# Patient Record
Sex: Male | Born: 1975 | Race: White | Hispanic: No | Marital: Married | Smoking: Never smoker
Health system: Southern US, Community
[De-identification: ages and names within clinical notes are randomized; demographics above are authoritative.]

## PROBLEM LIST (undated history)

## (undated) DIAGNOSIS — F419 Anxiety disorder, unspecified: Secondary | ICD-10-CM

## (undated) DIAGNOSIS — F32A Depression, unspecified: Secondary | ICD-10-CM

## (undated) DIAGNOSIS — Z8619 Personal history of other infectious and parasitic diseases: Secondary | ICD-10-CM

## (undated) DIAGNOSIS — F909 Attention-deficit hyperactivity disorder, unspecified type: Secondary | ICD-10-CM

## (undated) DIAGNOSIS — F329 Major depressive disorder, single episode, unspecified: Secondary | ICD-10-CM

## (undated) HISTORY — PX: TONSILLECTOMY: SUR1361

## (undated) HISTORY — DX: Attention-deficit hyperactivity disorder, unspecified type: F90.9

## (undated) HISTORY — PX: SHOULDER SURGERY: SHX246

## (undated) HISTORY — PX: ANKLE SURGERY: SHX546

## (undated) HISTORY — PX: FRACTURE SURGERY: SHX138

## (undated) HISTORY — DX: Personal history of other infectious and parasitic diseases: Z86.19

---

## 1998-12-11 ENCOUNTER — Encounter: Payer: Self-pay | Admitting: Internal Medicine

## 1998-12-11 ENCOUNTER — Ambulatory Visit (HOSPITAL_COMMUNITY): Admission: RE | Admit: 1998-12-11 | Discharge: 1998-12-11 | Payer: Self-pay | Admitting: Internal Medicine

## 1999-03-03 ENCOUNTER — Emergency Department (HOSPITAL_COMMUNITY): Admission: EM | Admit: 1999-03-03 | Discharge: 1999-03-04 | Payer: Self-pay | Admitting: Emergency Medicine

## 1999-03-03 ENCOUNTER — Encounter: Payer: Self-pay | Admitting: Emergency Medicine

## 1999-03-04 ENCOUNTER — Encounter: Payer: Self-pay | Admitting: Emergency Medicine

## 2006-11-23 ENCOUNTER — Emergency Department (HOSPITAL_COMMUNITY): Admission: EM | Admit: 2006-11-23 | Discharge: 2006-11-23 | Payer: Self-pay | Admitting: Family Medicine

## 2008-10-12 ENCOUNTER — Observation Stay (HOSPITAL_COMMUNITY): Admission: EM | Admit: 2008-10-12 | Discharge: 2008-10-13 | Payer: Self-pay | Admitting: Emergency Medicine

## 2008-11-10 ENCOUNTER — Ambulatory Visit: Payer: Self-pay | Admitting: Vascular Surgery

## 2008-11-10 ENCOUNTER — Emergency Department (HOSPITAL_COMMUNITY): Admission: EM | Admit: 2008-11-10 | Discharge: 2008-11-10 | Payer: Self-pay | Admitting: Emergency Medicine

## 2008-11-10 ENCOUNTER — Encounter (INDEPENDENT_AMBULATORY_CARE_PROVIDER_SITE_OTHER): Payer: Self-pay | Admitting: Specialist

## 2008-11-11 ENCOUNTER — Emergency Department (HOSPITAL_COMMUNITY): Admission: EM | Admit: 2008-11-11 | Discharge: 2008-11-11 | Payer: Self-pay | Admitting: Emergency Medicine

## 2008-11-12 ENCOUNTER — Encounter (HOSPITAL_COMMUNITY): Admission: RE | Admit: 2008-11-12 | Discharge: 2008-11-13 | Payer: Self-pay | Admitting: Emergency Medicine

## 2009-01-02 ENCOUNTER — Encounter: Admission: RE | Admit: 2009-01-02 | Discharge: 2009-01-02 | Payer: Self-pay | Admitting: Specialist

## 2009-01-06 ENCOUNTER — Emergency Department (HOSPITAL_COMMUNITY): Admission: EM | Admit: 2009-01-06 | Discharge: 2009-01-06 | Payer: Self-pay | Admitting: Emergency Medicine

## 2009-01-06 ENCOUNTER — Ambulatory Visit: Payer: Self-pay | Admitting: Surgery

## 2009-01-06 ENCOUNTER — Encounter (INDEPENDENT_AMBULATORY_CARE_PROVIDER_SITE_OTHER): Payer: Self-pay | Admitting: Emergency Medicine

## 2009-08-22 ENCOUNTER — Emergency Department (HOSPITAL_COMMUNITY): Admission: EM | Admit: 2009-08-22 | Discharge: 2009-08-22 | Payer: Self-pay | Admitting: Emergency Medicine
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
## 2010-12-05 ENCOUNTER — Encounter: Payer: Self-pay | Admitting: Specialist

## 2011-02-17 LAB — POCT I-STAT, CHEM 8
BUN: 17 mg/dL (ref 6–23)
Calcium, Ion: 1.2 mmol/L (ref 1.12–1.32)
Chloride: 107 meq/L (ref 96–112)
Creatinine, Ser: 0.8 mg/dL (ref 0.4–1.5)
Glucose, Bld: 108 mg/dL — ABNORMAL HIGH (ref 70–99)
HCT: 46 % (ref 39.0–52.0)
Hemoglobin: 15.6 g/dL (ref 13.0–17.0)
Potassium: 3.9 mEq/L (ref 3.5–5.1)
Sodium: 144 meq/L (ref 135–145)
TCO2: 26 mmol/L (ref 0–100)

## 2011-03-01 LAB — PROTIME-INR
INR: 2.6 — ABNORMAL HIGH (ref 0.00–1.49)
Prothrombin Time: 29.4 seconds — ABNORMAL HIGH (ref 11.6–15.2)
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
## 2011-08-16 LAB — BASIC METABOLIC PANEL
CO2: 28 mEq/L (ref 19–32)
GFR calc Af Amer: >60 mL/min (ref >60)
Glucose, Bld: 94 mg/dL (ref 70–99)
Sodium: 140 mEq/L (ref 135–145)

## 2011-08-16 LAB — DIFFERENTIAL
Basophils Absolute: 0 10*3/uL (ref 0.0–0.1)
Basophils Relative: 0 % (ref 0–1)
Eosinophils Absolute: 0 10*3/uL (ref 0.0–0.7)
Eosinophils Relative: 0 % (ref 0–5)
Lymphocytes Relative: 17 % (ref 12–46)
Lymphs Abs: 1.8 10*3/uL (ref 0.7–4.0)
Monocytes Absolute: 0.6 10*3/uL (ref 0.1–1.0)
Monocytes Relative: 5 % (ref 3–12)
Neutro Abs: 8.7 10*3/uL — ABNORMAL HIGH (ref 1.7–7.7)
Neutrophils Relative %: 78 % — ABNORMAL HIGH (ref 43–77)

## 2011-08-16 LAB — CBC
MCV: 100.4 fL — ABNORMAL HIGH (ref 78.0–100.0)
WBC: 11.2 10*3/uL — ABNORMAL HIGH (ref 4.0–10.5)

## 2011-08-19 LAB — COMPREHENSIVE METABOLIC PANEL
ALT: 12 U/L (ref 0–53)
AST: 18 U/L (ref 0–37)
Albumin: 3.8 g/dL (ref 3.5–5.2)
Alkaline Phosphatase: 79 U/L (ref 39–117)
Calcium: 9.2 mg/dL (ref 8.4–10.5)
Creatinine, Ser: 0.92 mg/dL (ref 0.4–1.5)
GFR calc non Af Amer: >60 mL/min (ref >60)
Glucose, Bld: 97 mg/dL (ref 70–99)
Potassium: 4 mEq/L (ref 3.5–5.1)
Sodium: 138 mEq/L (ref 135–145)

## 2011-08-19 LAB — CBC: HCT: 45.9 % (ref 39.0–52.0)

## 2011-08-19 LAB — DIFFERENTIAL
Basophils Absolute: 0 10*3/uL (ref 0.0–0.1)
Basophils Relative: 0 % (ref 0–1)
Eosinophils Absolute: 0.1 10*3/uL (ref 0.0–0.7)
Lymphocytes Relative: 26 % (ref 12–46)
Lymphs Abs: 2.4 10*3/uL (ref 0.7–4.0)
Monocytes Absolute: 0.4 10*3/uL (ref 0.1–1.0)
Monocytes Relative: 4 % (ref 3–12)
Neutrophils Relative %: 68 % (ref 43–77)

## 2011-08-19 LAB — PROTIME-INR: INR: 1 (ref 0.00–1.49)

## 2012-04-26 ENCOUNTER — Emergency Department (HOSPITAL_COMMUNITY)
Admission: EM | Admit: 2012-04-26 | Discharge: 2012-04-26 | Disposition: A | Discharge status: Home / Self Care | Payer: No Typology Code available for payment source

## 2012-04-26 ENCOUNTER — Emergency Department (HOSPITAL_COMMUNITY)
Admission: EM | Admit: 2012-04-26 | Discharge: 2012-04-26 | Disposition: A | Discharge status: Home / Self Care | Payer: No Typology Code available for payment source | Attending: Emergency Medicine | Admitting: Emergency Medicine

## 2012-04-26 ENCOUNTER — Encounter (HOSPITAL_COMMUNITY): Payer: Self-pay | Admitting: *Deleted

## 2012-04-26 DIAGNOSIS — L0889 Other specified local infections of the skin and subcutaneous tissue: Secondary | ICD-10-CM | POA: Insufficient documentation

## 2012-04-26 DIAGNOSIS — L089 Local infection of the skin and subcutaneous tissue, unspecified: Secondary | ICD-10-CM

## 2012-04-26 MED ORDER — CEPHALEXIN 500 MG PO CAPS: 500.0000 mg | ORAL_CAPSULE | Freq: Four times a day (QID) | ORAL | Status: AC

## 2012-04-26 NOTE — ED Notes (Signed)
Patient walked outside.  Attempted to talk to him but he continued to walk out the door.

## 2012-04-26 NOTE — ED Notes (Signed)
TRIAGE NURSE EXPLAINED PROCESS  TO PT. THAT HE WILL NEED TO SEEN BY A PROVIDER/ EDP BEFORE HE CAN RECEIVE A PAIN MEDICATION , PT. GOT UPSET WITH TRIAGE NURSE " GET OFF MY F ---ING FACE".

## 2012-04-26 NOTE — ED Notes (Signed)
To ED for eval of raised, red, painful area to back of head in scalp. States he had a bump and tried to pop it on Sunday.

## 2012-04-26 NOTE — Discharge Instructions (Signed)

## 2012-04-26 NOTE — ED Provider Notes (Signed)
History  Scribed for Kyle Co, MD, the patient was seen in room STRE1/STRE1. This chart was scribed by Candelaria Stagers. The patient's care started at 5:39 PM   CSN: [id number]  Arrival date & time 04/26/12  1642   First MD Initiated Contact with Patient 04/26/12 1731      Chief Complaint  Patient presents with  . Abscess   The history is provided by the patient.   Kyle Butler is a 36 y.o. male who presents to the Emergency Department complaining of an abscess on the back of his head that started about two years ago and has gotten worse after he tried to stick a pin in it four days ago.  He has h/o previous abscess to the head.   Nothing seems to make the sx better or worse.   History reviewed. No pertinent past medical history.  History reviewed. No pertinent past surgical history.  History reviewed. No pertinent family history.  History  Substance Use Topics  . Smoking status: Never Smoker   . Smokeless tobacco: Not on file  . Alcohol Use: No      Review of Systems  All other systems reviewed and are negative.    Allergies  Review of patient's allergies indicates no known allergies.  Home Medications   Current Outpatient Rx  Name Route Sig Dispense Refill  . CLONAZEPAM 1 MG PO TABS Oral Take 2 mg by mouth at bedtime.      BP 174/77  Pulse 78  Temp 98.1 F (36.7 C) (Oral)  Resp 20  SpO2 97%  Physical Exam  Nursing note and vitals reviewed. Constitutional: He is oriented to person, place, and time. He appears well-developed and well-nourished. No distress.  HENT:  Head: Normocephalic and atraumatic.       Mild 1 cm fluctuant mass.  Mild erythema.  No hair loss.  No drainage.   Eyes: EOM are normal.  Neck: Neck supple. No tracheal deviation present.  Cardiovascular: Normal rate.   Pulmonary/Chest: Effort normal. No respiratory distress.  Musculoskeletal: Normal range of motion.  Neurological: He is alert and oriented to person, place, and time.    Skin: Skin is warm and dry.  Psychiatric: He has a normal mood and affect. His behavior is normal.    ED Course  Procedures   INCISION AND DRAINAGE Performed by: Kyle Butler Consent: Verbal consent obtained. Risks and benefits: risks, benefits and alternatives were discussed Time out performed prior to procedure Type: abscess  Body area: Posterior scalp  Anesthesia: Local  Local anesthetic: lidocaine 2 % with epinephrine  Anesthetic total: 3 ml  Complexity: complex Blunt dissection to break up loculations  Drainage: Cystic body and non-foul smelling cystic discharge   Drainage amount: Small   Packing material: None   Patient tolerance: Patient tolerated the procedure well with no immediate complications.     DIAGNOSTIC STUDIES: Oxygen Saturation is 97% on room air, normal by my interpretation.    COORDINATION OF CARE:     Labs Reviewed - No data to display No results found.   1. Infected cyst of skin       MDM  Is likely infected sebaceous cyst.  This was incised at the bedside.  Home on antibiotics with PCP followup.  No indication for packing.    I personally performed the services described in this documentation, which was scribed in my presence. The recorded information has been reviewed and considered.  Kyle Co, MD 04/26/12 [phone]

## 2012-04-26 NOTE — ED Notes (Addendum)
Pt presented to the ER with complaint of an abscess to the back of his head which he attempted to drain by opening the abscess and squeezing the drainage but was unable to do it. Now the area is red and swollen. Pt denies any fever.

## 2012-04-26 NOTE — ED Notes (Signed)
Patient was seen by MD earlier and when asked if he needed pain med he said no but now he is hurting and is wanting something for pain

## 2012-04-26 NOTE — ED Notes (Signed)
Incision and drainage done by Dr. Patria Mane, patient tolerated the procedure well.Marland Kitchen

## 2012-11-30 ENCOUNTER — Encounter (HOSPITAL_COMMUNITY): Payer: Self-pay | Admitting: Emergency Medicine

## 2012-11-30 ENCOUNTER — Emergency Department (HOSPITAL_COMMUNITY)
Admission: EM | Admit: 2012-11-30 | Discharge: 2012-11-30 | Discharge status: Against Medical Advice | Payer: No Typology Code available for payment source | Attending: Emergency Medicine | Admitting: Emergency Medicine

## 2012-11-30 DIAGNOSIS — F329 Major depressive disorder, single episode, unspecified: Secondary | ICD-10-CM | POA: Insufficient documentation

## 2012-11-30 DIAGNOSIS — R55 Syncope and collapse: Secondary | ICD-10-CM | POA: Insufficient documentation

## 2012-11-30 HISTORY — DX: Anxiety disorder, unspecified: F41.9

## 2012-11-30 HISTORY — DX: Major depressive disorder, single episode, unspecified: F32.9

## 2012-11-30 HISTORY — DX: Depression, unspecified: F32.A

## 2012-11-30 NOTE — ED Notes (Signed)
Pt sts he feels much better and wishes to leave; pt IV removed and ambulated independently out of department

## 2012-11-30 NOTE — ED Notes (Signed)
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

## 2013-08-19 ENCOUNTER — Ambulatory Visit: Payer: No Typology Code available for payment source | Admitting: Interventional Cardiology

## 2013-09-05 ENCOUNTER — Encounter: Payer: Self-pay | Admitting: Interventional Cardiology

## 2013-09-05 DIAGNOSIS — F419 Anxiety disorder, unspecified: Secondary | ICD-10-CM | POA: Insufficient documentation

## 2013-09-06 ENCOUNTER — Ambulatory Visit (INDEPENDENT_AMBULATORY_CARE_PROVIDER_SITE_OTHER): Payer: No Typology Code available for payment source | Admitting: Interventional Cardiology

## 2013-09-06 ENCOUNTER — Encounter: Payer: Self-pay | Admitting: Interventional Cardiology

## 2013-09-06 VITALS — BP 118/70 | HR 59 | Ht 70.0 in | Wt 243.4 lb

## 2013-09-06 DIAGNOSIS — F411 Generalized anxiety disorder: Secondary | ICD-10-CM

## 2013-09-06 DIAGNOSIS — F329 Major depressive disorder, single episode, unspecified: Secondary | ICD-10-CM

## 2013-09-06 DIAGNOSIS — F419 Anxiety disorder, unspecified: Secondary | ICD-10-CM

## 2013-09-06 DIAGNOSIS — R002 Palpitations: Secondary | ICD-10-CM | POA: Insufficient documentation

## 2013-09-06 NOTE — Patient Instructions (Signed)
Your physician recommends that you continue on your current medications as directed. Please refer to the Current Medication list given to you today.  Your physician has recommended that you wear a holter monitor. Holter monitors are medical devices that record the heart's electrical activity. Doctors most often use these monitors to diagnose arrhythmias. Arrhythmias are problems with the speed or rhythm of the heartbeat. The monitor is a small, portable device. You can wear one while you do your normal daily activities. This is usually used to diagnose what is causing palpitations/syncope (passing out).   Your physician recommends that you schedule a follow-up appointment in: As Needed
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
## 2013-10-11 ENCOUNTER — Ambulatory Visit (INDEPENDENT_AMBULATORY_CARE_PROVIDER_SITE_OTHER): Payer: 59 | Admitting: Emergency Medicine

## 2013-10-11 VITALS — BP 125/80 | HR 77 | Temp 98.1°F | Resp 18 | Ht 70.0 in | Wt 246.0 lb

## 2013-10-11 DIAGNOSIS — Z0000 Encounter for general adult medical examination without abnormal findings: Secondary | ICD-10-CM

## 2013-10-11 DIAGNOSIS — F410 Panic disorder [episodic paroxysmal anxiety] without agoraphobia: Secondary | ICD-10-CM

## 2013-10-11 LAB — POCT CBC
Granulocyte percent: 55 %G (ref 37–80)
Hemoglobin: 16.4 g/dL (ref 14.1–18.1)
Lymph, poc: 2.5 (ref 0.6–3.4)
MCH, POC: 33 pg — AB (ref 27–31.2)
MPV: 8.6 fL (ref 0–99.8)
POC Granulocyte: 3.6 (ref 2–6.9)
POC LYMPH PERCENT: 38.6 %L (ref 10–50)
Platelet Count, POC: 216 10*3/uL (ref 142–424)
WBC: 6.5 10*3/uL (ref 4.6–10.2)

## 2013-10-11 LAB — POCT URINALYSIS DIPSTICK
Blood, UA: NEGATIVE
Nitrite, UA: NEGATIVE
Protein, UA: NEGATIVE

## 2013-10-11 LAB — LIPID PANEL
Cholesterol: 158 mg/dL (ref 0–200)
HDL: 33 mg/dL — ABNORMAL LOW (ref >39)
VLDL: 33 mg/dL (ref 0–40)

## 2013-10-11 LAB — COMPREHENSIVE METABOLIC PANEL
Alkaline Phosphatase: 67 U/L (ref 39–117)
BUN: 16 mg/dL (ref 6–23)
CO2: 32 mEq/L (ref 19–32)
Calcium: 9.5 mg/dL (ref 8.4–10.5)
Potassium: 4.2 mEq/L (ref 3.5–5.3)
Sodium: 140 mEq/L (ref 135–145)
Total Bilirubin: 0.8 mg/dL (ref 0.3–1.2)
Total Protein: 6.8 g/dL (ref 6.0–8.3)

## 2013-10-11 MED ORDER — PAROXETINE HCL 20 MG PO TABS: 20.0000 mg | ORAL_TABLET | Freq: Every day | ORAL | Status: DC
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
## 2013-10-11 NOTE — Addendum Note (Signed)
Addended by: Carmelina Dane on: 10/11/2013 12:16 PM   Modules accepted: Orders

## 2013-10-11 NOTE — Patient Instructions (Signed)

## 2014-10-27 ENCOUNTER — Ambulatory Visit (INDEPENDENT_AMBULATORY_CARE_PROVIDER_SITE_OTHER): Payer: 59 | Admitting: Internal Medicine

## 2014-10-27 DIAGNOSIS — Z0000 Encounter for general adult medical examination without abnormal findings: Secondary | ICD-10-CM

## 2014-10-27 DIAGNOSIS — Z13220 Encounter for screening for lipoid disorders: Secondary | ICD-10-CM

## 2014-10-27 DIAGNOSIS — Z79899 Other long term (current) drug therapy: Secondary | ICD-10-CM

## 2014-10-27 DIAGNOSIS — F39 Unspecified mood [affective] disorder: Secondary | ICD-10-CM

## 2014-10-27 DIAGNOSIS — Z1329 Encounter for screening for other suspected endocrine disorder: Secondary | ICD-10-CM

## 2014-10-27 LAB — POCT CBC
GRANULOCYTE PERCENT: 58.1 % (ref 37–80)
HCT, POC: 45.8 % (ref 43.5–53.7)
HEMOGLOBIN: 15.3 g/dL (ref 14.1–18.1)
Lymph, poc: 2.7 (ref 0.6–3.4)
MCH, POC: 33 pg — AB (ref 27–31.2)
MCHC: 33.4 g/dL (ref 31.8–35.4)
MCV: 98.6 fL — AB (ref 80–97)
MID (cbc): 0.3 (ref 0–0.9)
MPV: 7.5 fL (ref 0–99.8)
POC Granulocyte: 4.2 (ref 2–6.9)
POC LYMPH %: 37.3 % (ref 10–50)
POC MID %: 4.6 %M (ref 0–12)
Platelet Count, POC: 207 10*3/uL (ref 142–424)
RBC: 4.65 M/uL — AB (ref 4.69–6.13)
RDW, POC: 12.8 %
WBC: 7.3 10*3/uL (ref 4.6–10.2)

## 2014-10-27 LAB — TSH: TSH: 3.389 u[IU]/mL (ref 0.350–4.500)

## 2014-10-27 LAB — COMPREHENSIVE METABOLIC PANEL
ALBUMIN: 4 g/dL (ref 3.5–5.2)
ALK PHOS: 75 U/L (ref 39–117)
ALT: 13 U/L (ref 0–53)
AST: 19 U/L (ref 0–37)
BUN: 18 mg/dL (ref 6–23)
CALCIUM: 9.3 mg/dL (ref 8.4–10.5)
CHLORIDE: 107 meq/L (ref 96–112)
CO2: 24 mEq/L (ref 19–32)
Creat: 0.86 mg/dL (ref 0.50–1.35)
Glucose, Bld: 102 mg/dL — ABNORMAL HIGH (ref 70–99)
POTASSIUM: 4 meq/L (ref 3.5–5.3)
Sodium: 139 mEq/L (ref 135–145)
TOTAL PROTEIN: 6.5 g/dL (ref 6.0–8.3)
Total Bilirubin: 0.5 mg/dL (ref 0.2–1.2)

## 2014-10-27 LAB — LIPID PANEL
Cholesterol: 159 mg/dL (ref 0–200)
HDL: 33 mg/dL — ABNORMAL LOW (ref >39)
LDL CALC: 103 mg/dL — AB (ref 0–99)
TRIGLYCERIDES: 115 mg/dL (ref <150)
Total CHOL/HDL Ratio: 4.8 Ratio
VLDL: 23 mg/dL (ref 0–40)

## 2014-10-27 LAB — POCT URINALYSIS DIPSTICK
Bilirubin, UA: NEGATIVE
GLUCOSE UA: NEGATIVE
KETONES UA: NEGATIVE
Leukocytes, UA: NEGATIVE
NITRITE UA: NEGATIVE
PROTEIN UA: NEGATIVE
RBC UA: NEGATIVE
SPEC GRAV UA: 1.02
Urobilinogen, UA: 0.2
pH, UA: 5.5

## 2014-10-27 NOTE — Progress Notes (Signed)
   Subjective:    Patient ID: Kyle Butler, male    DOB: 1976-11-08, 38 y.o.   MRN: [id number]  HPI Doing well, exercising, has lost weight. Mood disorder stable, controlled with meds from Dr. Kizzie BaneHughes No complaints   Review of Systems  Constitutional: Negative.   HENT: Negative.   Eyes: Negative.   Respiratory: Negative.   Cardiovascular: Negative.   Gastrointestinal: Negative.   Endocrine: Negative.   Genitourinary: Negative.   Musculoskeletal: Negative.   Skin: Negative.   Allergic/Immunologic: Negative.   Neurological: Negative.   Hematological: Negative.   Psychiatric/Behavioral: Negative.        Objective:   Physical Exam  Constitutional: He is oriented to person, place, and time. He appears well-developed and well-nourished. No distress.  HENT:  Head: Normocephalic.  Right Ear: External ear normal.  Left Ear: External ear normal.  Nose: Nose normal.  Mouth/Throat: Oropharynx is clear and moist.  Eyes: Conjunctivae and EOM are normal. Pupils are equal, round, and reactive to light.  Neck: Normal range of motion. Neck supple. No tracheal deviation present. No thyromegaly present.  Cardiovascular: Normal rate, regular rhythm, normal heart sounds and intact distal pulses.   Pulmonary/Chest: Effort normal and breath sounds normal.  Abdominal: Soft. Bowel sounds are normal.  Genitourinary: Penis normal.  Musculoskeletal: Normal range of motion.  Neurological: He is alert and oriented to person, place, and time. He has normal reflexes. He exhibits normal muscle tone. Coordination normal.  Skin: No rash noted.  Psychiatric: He has a normal mood and affect. His behavior is normal. Judgment and thought content normal.  Vitals reviewed.  Results for orders placed or performed in visit on 10/27/14  POCT CBC  Result Value Ref Range   WBC 7.3 4.6 - 10.2 K/uL   Lymph, poc 2.7 0.6 - 3.4   POC LYMPH PERCENT 37.3 10 - 50 %L   MID (cbc) 0.3 0 - 0.9   POC MID % 4.6 0 - 12  %M   POC Granulocyte 4.2 2 - 6.9   Granulocyte percent 58.1 37 - 80 %G   RBC 4.65 (A) 4.69 - 6.13 M/uL   Hemoglobin 15.3 14.1 - 18.1 g/dL   HCT, POC 82.945.8 56.243.5 - 53.7 %   MCV 98.6 (A) 80 - 97 fL   MCH, POC 33.0 (A) 27 - 31.2 pg   MCHC 33.4 31.8 - 35.4 g/dL   RDW, POC 13.012.8 %   Platelet Count, POC 207 142 - 424 K/uL   MPV 7.5 0 - 99.8 fL  POCT urinalysis dipstick  Result Value Ref Range   Color, UA yellow    Clarity, UA clear    Glucose, UA neg    Bilirubin, UA neg    Ketones, UA neg    Spec Grav, UA 1.020    Blood, UA neg    pH, UA 5.5    Protein, UA neg    Urobilinogen, UA 0.2    Nitrite, UA neg    Leukocytes, UA Negative           Assessment & Plan:  CPE/Healthy

## 2014-10-27 NOTE — Patient Instructions (Signed)
Calorie Counting for Weight Loss Calories are energy you get from the things you eat and drink. Your body uses this energy to keep you going throughout the day. The number of calories you eat affects your weight. When you eat more calories than your body needs, your body stores the extra calories as fat. When you eat fewer calories than your body needs, your body burns fat to get the energy it needs. Calorie counting means keeping track of how many calories you eat and drink each day. If you make sure to eat fewer calories than your body needs, you should lose weight. In order for calorie counting to work, you will need to eat the number of calories that are right for you in a day to lose a healthy amount of weight per week. A healthy amount of weight to lose per week is usually 1-2 lb (0.5-0.9 kg). A dietitian can determine how many calories you need in a day and give you suggestions on how to reach your calorie goal.  WHAT IS MY MY PLAN? My goal is to have __________ calories per day.  If I have this many calories per day, I should lose around __________ pounds per week. WHAT DO I NEED TO KNOW ABOUT CALORIE COUNTING? In order to meet your daily calorie goal, you will need to:  Find out how many calories are in each food you would like to eat. Try to do this before you eat.  Decide how much of the food you can eat.  Write down what you ate and how many calories it had. Doing this is called keeping a food log. WHERE DO I FIND CALORIE INFORMATION? The number of calories in a food can be found on a Nutrition Facts label. Note that all the information on a label is based on a specific serving of the food. If a food does not have a Nutrition Facts label, try to look up the calories online or ask your dietitian for help. HOW DO I DECIDE HOW MUCH TO EAT? To decide how much of the food you can eat, you will need to consider both the number of calories in one serving and the size of one serving. This  information can be found on the Nutrition Facts label. If a food does not have a Nutrition Facts label, look up the information online or ask your dietitian for help. Remember that calories are listed per serving. If you choose to have more than one serving of a food, you will have to multiply the calories per serving by the amount of servings you plan to eat. For example, the label on a package of bread might say that a serving size is 1 slice and that there are 90 calories in a serving. If you eat 1 slice, you will have eaten 90 calories. If you eat 2 slices, you will have eaten 180 calories. HOW DO I KEEP A FOOD LOG? After each meal, record the following information in your food log:  What you ate.  How much of it you ate.  How many calories it had.  Then, add up your calories. Keep your food log near you, such as in a small notebook in your pocket. Another option is to use a mobile app or website. Some programs will calculate calories for you and show you how many calories you have left each time you add an item to the log. WHAT ARE SOME CALORIE COUNTING TIPS?  Use your calories on foods   and drinks that will fill you up and not leave you hungry. Some examples of this include foods like nuts and nut butters, vegetables, lean proteins, and high-fiber foods (more than 5 g fiber per serving).  Eat nutritious foods and avoid empty calories. Empty calories are calories you get from foods or beverages that do not have many nutrients, such as candy and soda. It is better to have a nutritious high-calorie food (such as an avocado) than a food with few nutrients (such as a bag of chips).  Know how many calories are in the foods you eat most often. This way, you do not have to look up how many calories they have each time you eat them.  Look out for foods that may seem like low-calorie foods but are really high-calorie foods, such as baked goods, soda, and fat-free candy.  Pay attention to calories  in drinks. Drinks such as sodas, specialty coffee drinks, alcohol, and juices have a lot of calories yet do not fill you up. Choose low-calorie drinks like water and diet drinks.  Focus your calorie counting efforts on higher calorie items. Logging the calories in a garden salad that contains only vegetables is less important than calculating the calories in a milk shake.  Find a way of tracking calories that works for you. Get creative. Most people who are successful find ways to keep track of how much they eat in a day, even if they do not count every calorie. WHAT ARE SOME PORTION CONTROL TIPS?  Know how many calories are in a serving. This will help you know how many servings of a certain food you can have.  Use a measuring cup to measure serving sizes. This is helpful when you start out. With time, you will be able to estimate serving sizes for some foods.  Take some time to put servings of different foods on your favorite plates, bowls, and cups so you know what a serving looks like.  Try not to eat straight from a bag or box. Doing this can lead to overeating. Put the amount you would like to eat in a cup or on a plate to make sure you are eating the right portion.  Use smaller plates, glasses, and bowls to prevent overeating. This is a quick and easy way to practice portion control. If your plate is smaller, less food can fit on it.  Try not to multitask while eating, such as watching TV or using your computer. If it is time to eat, sit down at a table and enjoy your food. Doing this will help you to start recognizing when you are full. It will also make you more aware of what and how much you are eating. HOW CAN I CALORIE COUNT WHEN EATING OUT?  Ask for smaller portion sizes or child-sized portions.  Consider sharing an entree and sides instead of getting your own entree.  If you get your own entree, eat only half. Ask for a box at the beginning of your meal and put the rest of your  entree in it so you are not tempted to eat it.  Look for the calories on the menu. If calories are listed, choose the lower calorie options.  Choose dishes that include vegetables, fruits, whole grains, low-fat dairy products, and lean protein. Focusing on smart food choices from each of the 5 food groups can help you stay on track at restaurants.  Choose items that are boiled, broiled, grilled, or steamed.  Choose   water, milk, unsweetened iced tea, or other drinks without added sugars. If you want an alcoholic beverage, choose a lower calorie option. For example, a regular margarita can have up to 700 calories and a glass of wine has around 150.  Stay away from items that are buttered, battered, fried, or served with cream sauce. Items labeled "crispy" are usually fried, unless stated otherwise.  Ask for dressings, sauces, and syrups on the side. These are usually very high in calories, so do not eat much of them.  Watch out for salads. Many people think salads are a healthy option, but this is often not the case. Many salads come with bacon, fried chicken, lots of cheese, fried chips, and dressing. All of these items have a lot of calories. If you want a salad, choose a garden salad and ask for grilled meats or steak. Ask for the dressing on the side, or ask for olive oil and vinegar or lemon to use as dressing.  Estimate how many servings of a food you are given. For example, a serving of cooked rice is  cup or about the size of half a tennis ball or one cupcake wrapper. Knowing serving sizes will help you be aware of how much food you are eating at restaurants. The list below tells you how big or small some common portion sizes are based on everyday objects.  1 oz--4 stacked dice.  3 oz--1 deck of cards.  1 tsp--1 dice.  1 Tbsp-- a Ping-Pong ball.  2 Tbsp--1 Ping-Pong ball.   cup--1 tennis ball or 1 cupcake wrapper.  1 cup--1 baseball. Document Released: 10/31/2005 Document  Revised: 03/17/2014 Document Reviewed: 09/05/2013 [employer] Patient Information 2015 [employer]. This information is not intended to replace advice given to you by your health care provider. Make sure you discuss any questions you have with your health care provider. DASH Eating Plan DASH stands for "Dietary Approaches to Stop Hypertension." The DASH eating plan is a healthy eating plan that has been shown to reduce high blood pressure (hypertension). Additional health benefits may include reducing the risk of type 2 diabetes mellitus, heart disease, and stroke. The DASH eating plan may also help with weight loss. WHAT DO I NEED TO KNOW ABOUT THE DASH EATING PLAN? For the DASH eating plan, you will follow these general guidelines:  Choose foods with a percent daily value for sodium of less than 5% (as listed on the food label).  Use salt-free seasonings or herbs instead of table salt or sea salt.  Check with your health care provider or pharmacist before using salt substitutes.  Eat lower-sodium products, often labeled as "lower sodium" or "no salt added."  Eat fresh foods.  Eat more vegetables, fruits, and low-fat dairy products.  Choose whole grains. Look for the word "whole" as the first word in the ingredient list.  Choose fish and skinless chicken or turkey more often than red meat. Limit fish, poultry, and meat to 6 oz (170 g) each day.  Limit sweets, desserts, sugars, and sugary drinks.  Choose heart-healthy fats.  Limit cheese to 1 oz (28 g) per day.  Eat more home-cooked food and less restaurant, buffet, and fast food.  Limit fried foods.  Cook foods using methods other than frying.  Limit canned vegetables. If you do use them, rinse them well to decrease the sodium.  When eating at a restaurant, ask that your food be prepared with less salt, or no salt if possible. WHAT FOODS CAN I   EAT? Seek help from a dietitian for individual calorie needs. Grains Whole grain  or whole wheat bread. Brown rice. Whole grain or whole wheat pasta. Quinoa, bulgur, and whole grain cereals. Low-sodium cereals. Corn or whole wheat flour tortillas. Whole grain cornbread. Whole grain crackers. Low-sodium crackers. Vegetables Fresh or frozen vegetables (raw, steamed, roasted, or grilled). Low-sodium or reduced-sodium tomato and vegetable juices. Low-sodium or reduced-sodium tomato sauce and paste. Low-sodium or reduced-sodium canned vegetables.  Fruits All fresh, canned (in natural juice), or frozen fruits. Meat and Other Protein Products Ground beef (85% or leaner), grass-fed beef, or beef trimmed of fat. Skinless chicken or turkey. Ground chicken or turkey. Pork trimmed of fat. All fish and seafood. Eggs. Dried beans, peas, or lentils. Unsalted nuts and seeds. Unsalted canned beans. Dairy Low-fat dairy products, such as skim or 1% milk, 2% or reduced-fat cheeses, low-fat ricotta or cottage cheese, or plain low-fat yogurt. Low-sodium or reduced-sodium cheeses. Fats and Oils Tub margarines without trans fats. Light or reduced-fat mayonnaise and salad dressings (reduced sodium). Avocado. Safflower, olive, or canola oils. Natural peanut or almond butter. Other Unsalted popcorn and pretzels. The items listed above may not be a complete list of recommended foods or beverages. Contact your dietitian for more options. WHAT FOODS ARE NOT RECOMMENDED? Grains White bread. White pasta. White rice. Refined cornbread. Bagels and croissants. Crackers that contain trans fat. Vegetables Creamed or fried vegetables. Vegetables in a cheese sauce. Regular canned vegetables. Regular canned tomato sauce and paste. Regular tomato and vegetable juices. Fruits Dried fruits. Canned fruit in light or heavy syrup. Fruit juice. Meat and Other Protein Products Fatty cuts of meat. Ribs, chicken wings, bacon, sausage, bologna, salami, chitterlings, fatback, hot dogs, bratwurst, and packaged luncheon meats.  Salted nuts and seeds. Canned beans with salt. Dairy Whole or 2% milk, cream, half-and-half, and cream cheese. Whole-fat or sweetened yogurt. Full-fat cheeses or blue cheese. Nondairy creamers and whipped toppings. Processed cheese, cheese spreads, or cheese curds. Condiments Onion and garlic salt, seasoned salt, table salt, and sea salt. Canned and packaged gravies. Worcestershire sauce. Tartar sauce. Barbecue sauce. Teriyaki sauce. Soy sauce, including reduced sodium. Steak sauce. Fish sauce. Oyster sauce. Cocktail sauce. Horseradish. Ketchup and mustard. Meat flavorings and tenderizers. Bouillon cubes. Hot sauce. Tabasco sauce. Marinades. Taco seasonings. Relishes. Fats and Oils Butter, stick margarine, lard, shortening, ghee, and bacon fat. Coconut, palm kernel, or palm oils. Regular salad dressings. Other Pickles and olives. Salted popcorn and pretzels. The items listed above may not be a complete list of foods and beverages to avoid. Contact your dietitian for more information. WHERE CAN I FIND MORE INFORMATION? National Heart, Lung, and [employer] Institute: www.nhlbi.nih.gov/health/health-topics/topics/dash/ Document Released: 10/20/2011 Document Revised: 03/17/2014 Document Reviewed: 09/04/2013 Exit[employer] Patient Information 2015 [employer]. This information is not intended to replace advice given to you by your health care provider. Make sure you discuss any questions you have with your health care provider.  

## 2014-10-28 ENCOUNTER — Encounter: Payer: Self-pay | Admitting: Radiology

## 2015-11-03 ENCOUNTER — Encounter: Payer: Self-pay | Admitting: Urgent Care

## 2015-11-03 ENCOUNTER — Ambulatory Visit (INDEPENDENT_AMBULATORY_CARE_PROVIDER_SITE_OTHER): Payer: 59 | Admitting: Urgent Care

## 2015-11-03 VITALS — BP 108/88 | HR 63 | Temp 97.8°F | Resp 18 | Ht 70.0 in | Wt 250.8 lb

## 2015-11-03 DIAGNOSIS — Z0000 Encounter for general adult medical examination without abnormal findings: Secondary | ICD-10-CM | POA: Diagnosis not present

## 2015-11-03 LAB — CBC
HEMATOCRIT: 45.5 % (ref 39.0–52.0)
Hemoglobin: 16 g/dL (ref 13.0–17.0)
MCH: 34.1 pg — AB (ref 26.0–34.0)
MCHC: 35.2 g/dL (ref 30.0–36.0)
MCV: 97 fL (ref 78.0–100.0)
MPV: 10.5 fL (ref 8.6–12.4)
Platelets: 189 10*3/uL (ref 150–400)
RBC: 4.69 MIL/uL (ref 4.22–5.81)
RDW: 12.6 % (ref 11.5–15.5)
WBC: 5.9 10*3/uL (ref 4.0–10.5)

## 2015-11-03 LAB — LIPID PANEL
Cholesterol: 169 mg/dL (ref 125–200)
HDL: 27 mg/dL — AB (ref >=40)
LDL CALC: 115 mg/dL (ref <130)
Total CHOL/HDL Ratio: 6.3 Ratio — ABNORMAL HIGH (ref <=5.0)
Triglycerides: 135 mg/dL (ref <150)
VLDL: 27 mg/dL (ref <30)

## 2015-11-03 LAB — COMPREHENSIVE METABOLIC PANEL
ALK PHOS: 63 U/L (ref 40–115)
ALT: 14 U/L (ref 9–46)
AST: 22 U/L (ref 10–40)
Albumin: 4.1 g/dL (ref 3.6–5.1)
BUN: 15 mg/dL (ref 7–25)
CALCIUM: 9 mg/dL (ref 8.6–10.3)
CHLORIDE: 104 mmol/L (ref 98–110)
CO2: 28 mmol/L (ref 20–31)
Creat: 0.9 mg/dL (ref 0.60–1.35)
GLUCOSE: 100 mg/dL — AB (ref 65–99)
POTASSIUM: 4.1 mmol/L (ref 3.5–5.3)
Sodium: 139 mmol/L (ref 135–146)
Total Bilirubin: 0.7 mg/dL (ref 0.2–1.2)
Total Protein: 6.3 g/dL (ref 6.1–8.1)

## 2015-11-03 LAB — TSH: TSH: 3.481 u[IU]/mL (ref 0.350–4.500)

## 2015-11-03 NOTE — Progress Notes (Signed)
    MRN: [id number]  Subjective:   Mr. Kyle Butler is a 39 y.o. male presenting for annual physical exam.  Medical care team includes: PCP: Lillia CarmelHILTS,MICHAEL J, MD Specialists: None.   Patient is married, lives at home with his wife and 2 children. He works as a Psychologist, occupationalwelder. Diet is unhealthy and does not exercise. Denies smoking cigarettes or drinking alcohol.   Kyle Butler has Depression; Anxiety; and Rapid palpitations on his problem list.  Kyle Butler has a current medication list which includes the following prescription(s): alprazolam, amphetamine-dextroamphetamine, and amphetamine-dextroamphetamine. He has No Known Allergies.  Kyle Butler  has a past medical history of Anxiety and Depression. Also  has past surgical history that includes Shoulder surgery; Ankle surgery; and Fracture surgery.  His family history includes Dementia in his mother; Parkinson's disease in his father.  Immunizations: Flu shot 08/2015  Review of Systems  Constitutional: Negative for fever, chills, weight loss, malaise/fatigue and diaphoresis.  HENT: Negative for congestion, ear discharge, ear pain, hearing loss, nosebleeds, sore throat and tinnitus.   Eyes: Negative for blurred vision, double vision, photophobia, pain, discharge and redness.  Respiratory: Negative for cough, shortness of breath and wheezing.   Cardiovascular: Negative for chest pain, palpitations and leg swelling.  Gastrointestinal: Negative for nausea, vomiting, abdominal pain, diarrhea, constipation and blood in stool.  Genitourinary: Negative for dysuria, urgency, frequency, hematuria and flank pain.  Musculoskeletal: Negative for myalgias, back pain and joint pain.  Skin: Negative for itching and rash.  Neurological: Negative for dizziness, tingling, seizures, loss of consciousness, weakness and headaches.  Endo/Heme/Allergies: Negative for polydipsia.  Psychiatric/Behavioral: Negative for depression, suicidal ideas, hallucinations, memory loss and  substance abuse. The patient is not nervous/anxious and does not have insomnia.      Objective:   Vitals: BP 108/88 mmHg  Pulse 63  Temp(Src) 97.8 F (36.6 C) (Oral)  Resp 18  Ht 5\' 10"  (1.778 m)  Wt 250 lb 12.8 oz (113.762 kg)  BMI 35.99 kg/m2  SpO2 98%  Physical Exam  Constitutional: He is oriented to person, place, and time. He appears well-developed and well-nourished.  HENT:  TM's intact bilaterally, no effusions or erythema. Nasal turbinates pink and moist, nasal passages patent. No sinus tenderness. Oropharynx clear, mucous membranes moist, dentition in good repair.  Eyes: Conjunctivae and EOM are normal. Pupils are equal, round, and reactive to light. Right eye exhibits no discharge. Left eye exhibits no discharge. No scleral icterus.  Neck: Normal range of motion. Neck supple. No thyromegaly present.  Cardiovascular: Normal rate, regular rhythm and intact distal pulses.  Exam reveals no gallop and no friction rub.   No murmur heard. Pulmonary/Chest: No stridor. No respiratory distress. He has no wheezes. He has no rales.  Abdominal: Soft. Bowel sounds are normal. He exhibits no distension and no mass. There is no tenderness.  Musculoskeletal: Normal range of motion. He exhibits no edema or tenderness.  Lymphadenopathy:    He has no cervical adenopathy.  Neurological: He is alert and oriented to person, place, and time.  Skin: Skin is warm and dry. No rash noted. No erythema. No pallor.  Psychiatric: He has a normal mood and affect.   Assessment and Plan :   1. Annual physical exam - Patient is medically healthy, labs pending. - Discussed healthy lifestyle, diet, exercise, preventative care, vaccinations, and addressed patient's concerns.   Wallis BambergMario Dazhane Villagomez, PA-C [facility] [phone] 11/03/2015  8:55 AM

## 2015-11-03 NOTE — Patient Instructions (Signed)

## 2016-10-12 ENCOUNTER — Ambulatory Visit (INDEPENDENT_AMBULATORY_CARE_PROVIDER_SITE_OTHER): Payer: 59 | Admitting: Family

## 2016-10-12 ENCOUNTER — Encounter: Payer: Self-pay | Admitting: Family

## 2016-10-12 ENCOUNTER — Other Ambulatory Visit (INDEPENDENT_AMBULATORY_CARE_PROVIDER_SITE_OTHER): Payer: 59

## 2016-10-12 VITALS — BP 120/90 | HR 73 | Temp 98.4°F | Resp 16 | Ht 70.0 in | Wt 257.0 lb

## 2016-10-12 DIAGNOSIS — E6609 Other obesity due to excess calories: Secondary | ICD-10-CM | POA: Diagnosis not present

## 2016-10-12 DIAGNOSIS — Z0000 Encounter for general adult medical examination without abnormal findings: Secondary | ICD-10-CM

## 2016-10-12 DIAGNOSIS — Z6836 Body mass index (BMI) 36.0-36.9, adult: Secondary | ICD-10-CM

## 2016-10-12 DIAGNOSIS — E66812 Obesity, class 2: Secondary | ICD-10-CM

## 2016-10-12 DIAGNOSIS — Z23 Encounter for immunization: Secondary | ICD-10-CM | POA: Diagnosis not present

## 2016-10-12 DIAGNOSIS — E669 Obesity, unspecified: Secondary | ICD-10-CM | POA: Insufficient documentation

## 2016-10-12 LAB — COMPREHENSIVE METABOLIC PANEL
ALBUMIN: 4 g/dL (ref 3.5–5.2)
ALT: 13 U/L (ref 0–53)
AST: 20 U/L (ref 0–37)
Alkaline Phosphatase: 64 U/L (ref 39–117)
BILIRUBIN TOTAL: 0.6 mg/dL (ref 0.2–1.2)
BUN: 25 mg/dL — ABNORMAL HIGH (ref 6–23)
CALCIUM: 9 mg/dL (ref 8.4–10.5)
CO2: 31 meq/L (ref 19–32)
CREATININE: 0.92 mg/dL (ref 0.40–1.50)
Chloride: 105 mEq/L (ref 96–112)
GFR: 96.92 mL/min (ref >60.00)
Glucose, Bld: 97 mg/dL (ref 70–99)
Potassium: 4.2 mEq/L (ref 3.5–5.1)
Sodium: 141 mEq/L (ref 135–145)
TOTAL PROTEIN: 6.5 g/dL (ref 6.0–8.3)

## 2016-10-12 LAB — CBC
HCT: 43.9 % (ref 39.0–52.0)
Hemoglobin: 15.1 g/dL (ref 13.0–17.0)
MCHC: 34.4 g/dL (ref 30.0–36.0)
MCV: 96.2 fl (ref 78.0–100.0)
PLATELETS: 197 10*3/uL (ref 150.0–400.0)
RBC: 4.56 Mil/uL (ref 4.22–5.81)
RDW: 12.6 % (ref 11.5–15.5)
WBC: 8.4 10*3/uL (ref 4.0–10.5)

## 2016-10-12 LAB — LIPID PANEL
CHOLESTEROL: 151 mg/dL (ref 0–200)
HDL: 29.9 mg/dL — ABNORMAL LOW (ref >39.00)
LDL CALC: 83 mg/dL (ref 0–99)
NonHDL: 121.41
Total CHOL/HDL Ratio: 5
Triglycerides: 193 mg/dL — ABNORMAL HIGH (ref 0.0–149.0)
VLDL: 38.6 mg/dL (ref 0.0–40.0)

## 2016-10-12 LAB — TSH: TSH: 3.54 u[IU]/mL (ref 0.35–4.50)

## 2016-10-12 NOTE — Progress Notes (Signed)
Subjective:    Patient ID: Kyle Butler, male    DOB: 05/10/1976, 40 y.o.   MRN: [id number]  Chief Complaint  Patient presents with  . Establish Care    CPE, not fasting     HPI:  Kyle Butler is a 40 y.o. male who presents today for an annual wellness visit.   1) Health Maintenance -   Diet - Averages about 2-3 meals per day consisting of a regular diet; Caffeine intake of 1-2 cups daily  Exercise - 2-3 times per week at the gym primarily cardio.    2) Preventative Exams / Immunizations:  Dental -- Due for exam  Vision -- Due for exam   Health Maintenance  Topic Date Due  . HIV Screening  12/06/1991  . TETANUS/TDAP  12/06/1995  . INFLUENZA VACCINE  02/11/2017 (Originally 06/14/2016)    Immunization History  Administered Date(s) Administered  . Influenza-Unspecified 07/28/2014  . Tdap 10/12/2016     No Known Allergies   Outpatient Medications Prior to Visit  Medication Sig Dispense Refill  . ALPRAZolam (XANAX PO) Take by mouth.    Marland Kitchen amphetamine-dextroamphetamine (ADDERALL XR) 10 MG 24 hr capsule Take 10 mg by mouth daily. Reported on 11/03/2015    . amphetamine-dextroamphetamine (ADDERALL) 15 MG tablet Take 15 mg by mouth daily.     No facility-administered medications prior to visit.      Past Medical History:  Diagnosis Date  . ADHD   . Anxiety   . Depression   . History of chicken pox      Past Surgical History:  Procedure Laterality Date  . ANKLE SURGERY     RIGHT  . FRACTURE SURGERY    . SHOULDER SURGERY     LEFT  . TONSILLECTOMY       Family History  Problem Relation Age of Onset  . Dementia Mother   . Parkinson's disease Father      Social History   Social History  . Marital status: Married    Spouse name: N/A  . Number of children: 2  . Years of education: 14   Occupational History  . Welder    Social History Main Topics  . Smoking status: Never Smoker  . Smokeless tobacco: Never Used  . Alcohol use No  .  Drug use: No  . Sexual activity: Yes   Other Topics Concern  . Not on file   Social History Narrative   Fun: Work in the garage; build cars       Review of Systems  Constitutional: Denies fever, chills, fatigue, or significant weight gain/loss. HENT: Head: Denies headache or neck pain Ears: Denies changes in hearing, ringing in ears, earache, drainage Nose: Denies discharge, stuffiness, itching, nosebleed, sinus pain Throat: Denies sore throat, hoarseness, dry mouth, sores, thrush Eyes: Denies loss/changes in vision, pain, redness, blurry/double vision, flashing lights Cardiovascular: Denies chest pain/discomfort, tightness, palpitations, shortness of breath with activity, difficulty lying down, swelling, sudden awakening with shortness of breath Respiratory: Denies shortness of breath, cough, sputum production, wheezing Gastrointestinal: Denies dysphasia, heartburn, change in appetite, nausea, change in bowel habits, rectal bleeding, constipation, diarrhea, yellow skin or eyes Genitourinary: Denies frequency, urgency, burning/pain, blood in urine, incontinence, change in urinary strength. Musculoskeletal: Denies muscle/joint pain, stiffness, back pain, redness or swelling of joints, trauma Skin: Denies rashes, lumps, itching, dryness, color changes, or hair/nail changes Neurological: Denies dizziness, fainting, seizures, weakness, numbness, tingling, tremor Psychiatric - Denies nervousness, stress, depression or memory loss Endocrine: Denies  heat or cold intolerance, sweating, frequent urination, excessive thirst, changes in appetite Hematologic: Denies ease of bruising or bleeding     Objective:     BP 120/90 (BP Location: Left Arm, Patient Position: Sitting, Cuff Size: Large)   Pulse 73   Temp 98.4 F (36.9 C) (Oral)   Resp 16   Ht 5\' 10"  (1.778 m)   Wt 257 lb (116.6 kg)   SpO2 96%   BMI 36.88 kg/m  Nursing note and vital signs reviewed.  Physical Exam    Constitutional: He is oriented to person, place, and time. He appears well-developed and well-nourished.  HENT:  Head: Normocephalic.  Right Ear: Hearing, tympanic membrane, external ear and ear canal normal.  Left Ear: Hearing, tympanic membrane, external ear and ear canal normal.  Nose: Nose normal.  Mouth/Throat: Uvula is midline, oropharynx is clear and moist and mucous membranes are normal.  Eyes: Conjunctivae and EOM are normal. Pupils are equal, round, and reactive to light.  Neck: Neck supple. No JVD present. No tracheal deviation present. No thyromegaly present.  Cardiovascular: Normal rate, regular rhythm, normal heart sounds and intact distal pulses.   Pulmonary/Chest: Effort normal and breath sounds normal.  Abdominal: Soft. Bowel sounds are normal. He exhibits no distension and no mass. There is no tenderness. There is no rebound and no guarding.  Musculoskeletal: Normal range of motion. He exhibits no edema or tenderness.  Lymphadenopathy:    He has no cervical adenopathy.  Neurological: He is alert and oriented to person, place, and time. He has normal reflexes. No cranial nerve deficit. He exhibits normal muscle tone. Coordination normal.  Skin: Skin is warm and dry.  Psychiatric: He has a normal mood and affect. His behavior is normal. Judgment and thought content normal.       Assessment & Plan:   Problem List Items Addressed This Visit      Other   Routine general medical examination at a health care facility - Primary    1) Anticipatory Guidance: Discussed importance of wearing a seatbelt while driving and not texting while driving; changing batteries in smoke detector at least once annually; wearing suntan lotion when outside; eating a balanced and moderate diet; getting physical activity at least 30 minutes per day.  2) Immunizations / Screenings / Labs:  Tetanus updated today. Declines influenza. All other immunizations are up to date per recommendations. Due  for a dental and vision exam encouraged to be completed independently. All other screenings are up to date per recommendations. Obtain CBC, TSH, CMET, and lipid profile.    Overall well exam with risk factors for cardiovascular disease including obesity. Recommend weight loss of 5-10% of current body weight nutrition and physical activity. Work and family life are going well and attends activities with the family on the weekends. Continue other healthy lifestyle behaviors and choices. Follow-up prevention exam in 1 year. Follow-up office visit pending blood work as needed.       Relevant Orders   CBC   Comprehensive metabolic panel   TSH   Lipid Profile   Obesity    BMI of 36.8. Recommend weight loss of 5-10% of current body weight. Recommend increasing physical activity to 30 minutes of moderate level activity daily. Encourage nutritional intake that focuses on nutrient dense foods and is moderate, varied, and balanced and is low in saturated fats and processed/sugary foods. Continue to monitor.         Other Visit Diagnoses    Need for diphtheria-tetanus-pertussis (  Tdap) vaccine       Relevant Orders   Tdap vaccine greater than or equal to 7yo IM (Completed)       I am having Mr. Bomkamp maintain his ALPRAZolam (XANAX PO), amphetamine-dextroamphetamine, and amphetamine-dextroamphetamine.   Follow-up: Return if symptoms worsen or fail to improve.   Jeanine Luzalone, Gregory, FNP

## 2016-10-12 NOTE — Assessment & Plan Note (Signed)
BMI of 36.8. Recommend weight loss of 5-10% of current body weight. Recommend increasing physical activity to 30 minutes of moderate level activity daily. Encourage nutritional intake that focuses on nutrient dense foods and is moderate, varied, and balanced and is low in saturated fats and processed/sugary foods. Continue to monitor.

## 2016-10-12 NOTE — Patient Instructions (Addendum)
Thank you for choosing [facility].  SUMMARY AND INSTRUCTIONS:   Medication:  Please continue to take your medication as prescribed.   Your prescription(s) have been submitted to your pharmacy or been printed and provided for you. Please take as directed and contact our office if you believe you are having problem(s) with the medication(s) or have any questions.  Labs:  Please stop by the lab on the lower level of the building for your blood work. Your results will be released to MyChart (or called to you) after review, usually within 72 hours after test completion. If any changes need to be made, you will be notified at that same time.  1.) The lab is open from 7:30am to 5:30 pm Monday-Friday 2.) No appointment is necessary 3.) Fasting (if needed) is 6-8 hours after food and drink; black coffee and water are okay   Imaging / Radiology:  Please stop by radiology on the basement level of the building for your x-rays. Your results will be released to MyChart (or called to you) after review, usually within 72 hours after test completion. If any treatments or changes are necessary, you will be notified at that same time.  Referrals:  Referrals have been made during this visit. You should expect to hear back from our schedulers in about 7-10 days in regards to establishing an appointment with the specialists we discussed.   Follow up:  If your symptoms worsen or fail to improve, please contact our office for further instruction, or in case of emergency go directly to the emergency room at the closest medical facility.   Health Maintenance, Male A healthy lifestyle and preventative care can promote health and wellness.  Maintain regular health, dental, and eye exams.  Eat a healthy diet. Foods like vegetables, fruits, whole grains, low-fat dairy products, and lean protein foods contain the nutrients you need and are low in calories. Decrease your intake of foods high in solid  fats, added sugars, and salt. Get information about a proper diet from your health care provider, if necessary.  Regular physical exercise is one of the most important things you can do for your health. Most adults should get at least 150 minutes of moderate-intensity exercise (any activity that increases your heart rate and causes you to sweat) each week. In addition, most adults need muscle-strengthening exercises on 2 or more days a week.   Maintain a healthy weight. The body mass index (BMI) is a screening tool to identify possible weight problems. It provides an estimate of body fat based on height and weight. Your health care provider can find your BMI and can help you achieve or maintain a healthy weight. For males 20 years and older:  A BMI below 18.5 is considered underweight.  A BMI of 18.5 to 24.9 is normal.  A BMI of 25 to 29.9 is considered overweight.  A BMI of 30 and above is considered obese.  Maintain normal blood lipids and cholesterol by exercising and minimizing your intake of saturated fat. Eat a balanced diet with plenty of fruits and vegetables. Blood tests for lipids and cholesterol should begin at age 27 and be repeated every 5 years. If your lipid or cholesterol levels are high, you are over age 72, or you are at high risk for heart disease, you may need your cholesterol levels checked more frequently.Ongoing high lipid and cholesterol levels should be treated with medicines if diet and exercise are not working.  If you smoke, find out from  your health care provider how to quit. If you do not use tobacco, do not start.  Lung cancer screening is recommended for adults aged 55-80 years who are at high risk for developing lung cancer because of a history of smoking. A yearly low-dose CT scan of the lungs is recommended for people who have at least a 30-pack-year history of smoking and are current smokers or have quit within the past 15 years. A pack year of smoking is  smoking an average of 1 pack of cigarettes a day for 1 year (for example, a 30-pack-year history of smoking could mean smoking 1 pack a day for 30 years or 2 packs a day for 15 years). Yearly screening should continue until the smoker has stopped smoking for at least 15 years. Yearly screening should be stopped for people who develop a health problem that would prevent them from having lung cancer treatment.  If you choose to drink alcohol, do not have more than 2 drinks per day. One drink is considered to be 12 oz (360 mL) of beer, 5 oz (150 mL) of wine, or 1.5 oz (45 mL) of liquor.  Avoid the use of street drugs. Do not share needles with anyone. Ask for help if you need support or instructions about stopping the use of drugs.  High blood pressure causes heart disease and increases the risk of stroke. High blood pressure is more likely to develop in:  People who have blood pressure in the end of the normal range (100-139/85-89 mm Hg).  People who are overweight or obese.  People who are African American.  If you are 3018-39 years of age, have your blood pressure checked every 3-5 years. If you are 39 years of age or older, have your blood pressure checked every year. You should have your blood pressure measured twice-once when you are at a hospital or clinic, and once when you are not at a hospital or clinic. Record the average of the two measurements. To check your blood pressure when you are not at a hospital or clinic, you can use:  An automated blood pressure machine at a pharmacy.  A home blood pressure monitor.  If you are 5645-39 years old, ask your health care provider if you should take aspirin to prevent heart disease.  Diabetes screening involves taking a blood sample to check your fasting blood sugar level. This should be done once every 3 years after age 39 if you are at a normal weight and without risk factors for diabetes. Testing should be considered at a younger age or be carried  out more frequently if you are overweight and have at least 1 risk factor for diabetes.  Colorectal cancer can be detected and often prevented. Most routine colorectal cancer screening begins at the age of 39 and continues through age 39. However, your health care provider may recommend screening at an earlier age if you have risk factors for colon cancer. On a yearly basis, your health care provider may provide home test kits to check for hidden blood in the stool. A small camera at the end of a tube may be used to directly examine the colon (sigmoidoscopy or colonoscopy) to detect the earliest forms of colorectal cancer. Talk to your health care provider about this at age 39 when routine screening begins. A direct exam of the colon should be repeated every 5-10 years through age 39, unless early forms of precancerous polyps or small growths are found.  People who  are at an increased risk for hepatitis B should be screened for this virus. You are considered at high risk for hepatitis B if:  You were born in a country where hepatitis B occurs often. Talk with your health care provider about which countries are considered high risk.  Your parents were born in a high-risk country and you have not received a shot to protect against hepatitis B (hepatitis B vaccine).  You have HIV or AIDS.  You use needles to inject street drugs.  You live with, or have sex with, someone who has hepatitis B.  You are a man who has sex with other men (MSM).  You get hemodialysis treatment.  You take certain medicines for conditions like cancer, organ transplantation, and autoimmune conditions.  Hepatitis C blood testing is recommended for all people born from [id number] through 1964 and any individual with known risk factors for hepatitis C.  Healthy men should no longer receive prostate-specific antigen (PSA) blood tests as part of routine cancer screening. Talk to your health care provider about prostate cancer  screening.  Testicular cancer screening is not recommended for adolescents or adult males who have no symptoms. Screening includes self-exam, a health care provider exam, and other screening tests. Consult with your health care provider about any symptoms you have or any concerns you have about testicular cancer.  Practice safe sex. Use condoms and avoid high-risk sexual practices to reduce the spread of sexually transmitted infections (STIs).  You should be screened for STIs, including gonorrhea and chlamydia if:  You are sexually active and are younger than 24 years.  You are older than 24 years, and your health care provider tells you that you are at risk for this type of infection.  Your sexual activity has changed since you were last screened, and you are at an increased risk for chlamydia or gonorrhea. Ask your health care provider if you are at risk.  If you are at risk of being infected with HIV, it is recommended that you take a prescription medicine daily to prevent HIV infection. This is called pre-exposure prophylaxis (PrEP). You are considered at risk if:  You are a man who has sex with other men (MSM).  You are a heterosexual man who is sexually active with multiple partners.  You take drugs by injection.  You are sexually active with a partner who has HIV.  Talk with your health care provider about whether you are at high risk of being infected with HIV. If you choose to begin PrEP, you should first be tested for HIV. You should then be tested every 3 months for as long as you are taking PrEP.  Use sunscreen. Apply sunscreen liberally and repeatedly throughout the day. You should seek shade when your shadow is shorter than you. Protect yourself by wearing long sleeves, pants, a wide-brimmed hat, and sunglasses year round whenever you are outdoors.  Tell your health care provider of new moles or changes in moles, especially if there is a change in shape or color. Also, tell  your health care provider if a mole is larger than the size of a pencil eraser.  A one-time screening for abdominal aortic aneurysm (AAA) and surgical repair of large AAAs by ultrasound is recommended for men aged 65-75 years who are current or former smokers.  Stay current with your vaccines (immunizations). This information is not intended to replace advice given to you by your health care provider. Make sure you discuss any questions you  have with your health care provider. Document Released: 04/28/2008 Document Revised: 11/21/2014 Document Reviewed: 08/04/2015 Elsevier Interactive Patient Education  2017 [employer].

## 2016-10-12 NOTE — Assessment & Plan Note (Signed)
1) Anticipatory Guidance: Discussed importance of wearing a seatbelt while driving and not texting while driving; changing batteries in smoke detector at least once annually; wearing suntan lotion when outside; eating a balanced and moderate diet; getting physical activity at least 30 minutes per day.  2) Immunizations / Screenings / Labs:  Tetanus updated today. Declines influenza. All other immunizations are up to date per recommendations. Due for a dental and vision exam encouraged to be completed independently. All other screenings are up to date per recommendations. Obtain CBC, TSH, CMET, and lipid profile.    Overall well exam with risk factors for cardiovascular disease including obesity. Recommend weight loss of 5-10% of current body weight nutrition and physical activity. Work and family life are going well and attends activities with the family on the weekends. Continue other healthy lifestyle behaviors and choices. Follow-up prevention exam in 1 year. Follow-up office visit pending blood work as needed.

## 2016-10-13 ENCOUNTER — Telehealth: Payer: Self-pay | Admitting: Family

## 2016-10-13 NOTE — Telephone Encounter (Signed)
Patient Name: Kyle RenshawNDREW Butler DOB: Mar 26, 1976 Initial Comment Caller states husband got tetanus shot yesterday, arm is sore, has temp 102.6. Nurse Assessment Nurse: Charna Elizabethrumbull, RN, Cathy Date/Time Lamount Cohen(Eastern Time): 10/13/2016 12:20:32 PM Confirm and document reason for call. If symptomatic, describe symptoms. ---Wilmon ArmsMisha states [name] had a tetanus shot in his left arm yesterday. He developed fever and muscle aches last night (101.1 oral a few minutes ago). No severe breathing or swallowing difficulty. Alert and responsive. Does the patient have any new or worsening symptoms? ---Yes Will a triage be completed? ---Yes Related visit to physician within the last 2 weeks? ---Yes Does the PT have any chronic conditions? (i.e. diabetes, asthma, etc.) ---Yes List chronic conditions. ---Tetanus immunization yesterday, Anxiety, Depression, ADHD Is this a behavioral health or substance abuse call? ---No Guidelines Guideline Title Affirmed Question Affirmed Notes Immunization Reactions Tetanus-diphtheria (Td) vaccine reactions (all triage questions negative) Final Disposition User Home Care [place] Creekrumbull, RN, Lynden Angathy Disagree/Comply: Comply

## 2018-11-26 ENCOUNTER — Ambulatory Visit
Admission: EM | Admit: 2018-11-26 | Discharge: 2018-11-26 | Disposition: A | Discharge status: Home / Self Care | Payer: BLUE CROSS/BLUE SHIELD | Attending: Family Medicine | Admitting: Family Medicine

## 2018-11-26 DIAGNOSIS — R509 Fever, unspecified: Secondary | ICD-10-CM

## 2018-11-26 DIAGNOSIS — R69 Illness, unspecified: Secondary | ICD-10-CM | POA: Insufficient documentation

## 2018-11-26 DIAGNOSIS — R05 Cough: Secondary | ICD-10-CM | POA: Diagnosis not present

## 2018-11-26 DIAGNOSIS — R0989 Other specified symptoms and signs involving the circulatory and respiratory systems: Secondary | ICD-10-CM

## 2018-11-26 DIAGNOSIS — J111 Influenza due to unidentified influenza virus with other respiratory manifestations: Secondary | ICD-10-CM

## 2018-11-26 MED ORDER — OSELTAMIVIR PHOSPHATE 75 MG PO CAPS: 75.0000 mg | ORAL_CAPSULE | Freq: Two times a day (BID) | ORAL | 0 refills | Status: DC

## 2018-11-26 MED ORDER — ONDANSETRON HCL 4 MG PO TABS: 4.0000 mg | ORAL_TABLET | Freq: Four times a day (QID) | ORAL | 0 refills | Status: DC

## 2018-11-26 NOTE — ED Triage Notes (Signed)
Pt c/o flu sx's since yesterday, bodyaches, congestion and cough; states kids at home has been dx with flu

## 2018-11-26 NOTE — Discharge Instructions (Signed)
Rest Push fluids OTC cough and cold medicine Tamiflu 2 x a day

## 2018-11-26 NOTE — ED Provider Notes (Signed)
[facility]    CSN: [id number] Arrival date & time: 11/26/18  1000     History   Chief Complaint Chief Complaint  Patient presents with  . Influenza    HPI Kyle Butler is a 43 y.o. male.   HPI  Patient is here for treatment of influenza.  Both of his children have influenza were diagnosed last week.  His wife is also on Tamiflu.  He started having symptoms last night.  He has runny and stuffy nose, cough, chest pain, fever.  He is taken some over-the-counter medicines.  He is here mostly because he needs a note for work.  He would also like prescription for Tamiflu.  He is otherwise in good health.  No underlying cigarette smoking, asthma, lung disease.  Past Medical History:  Diagnosis Date  . ADHD   . Anxiety   . Depression   . History of chicken pox     Patient Active Problem List   Diagnosis Date Noted  . Routine general medical examination at a health care facility 10/12/2016  . Obesity 10/12/2016  . Rapid palpitations 09/06/2013    Class: Acute  . Anxiety   . Depression     Past Surgical History:  Procedure Laterality Date  . ANKLE SURGERY     RIGHT  . FRACTURE SURGERY    . SHOULDER SURGERY     LEFT  . TONSILLECTOMY         Home Medications    Prior to Admission medications   Medication Sig Start Date End Date Taking? Authorizing Provider  amphetamine-dextroamphetamine (ADDERALL XR) 10 MG 24 hr capsule Take 10 mg by mouth daily. Reported on 11/03/2015    [provider]  amphetamine-dextroamphetamine (ADDERALL) 15 MG tablet Take 15 mg by mouth daily.    [provider]  ondansetron (ZOFRAN) 4 MG tablet Take 1 tablet (4 mg total) by mouth every 6 (six) hours. 11/26/18   Eustace MooreNelson, Wesam Gearhart Sue, MD  oseltamivir (TAMIFLU) 75 MG capsule Take 1 capsule (75 mg total) by mouth every 12 (twelve) hours. 11/26/18   Eustace MooreNelson, Yarisa Lynam Sue, MD    Family History Family History  Problem Relation Age of Onset  . Dementia Mother   .  Parkinson's disease Father     Social History Social History   Tobacco Use  . Smoking status: Never Smoker  . Smokeless tobacco: Never Used  Substance Use Topics  . Alcohol use: No  . Drug use: No     Allergies   Patient has no known allergies.   Review of Systems Review of Systems  Constitutional: Positive for chills, fatigue and fever.  HENT: Positive for congestion, postnasal drip and rhinorrhea. Negative for ear pain and sore throat.   Eyes: Negative for pain and visual disturbance.  Respiratory: Positive for cough. Negative for shortness of breath.   Cardiovascular: Negative for chest pain and palpitations.  Gastrointestinal: Positive for nausea. Negative for abdominal pain and vomiting.  Genitourinary: Negative for dysuria and hematuria.  Musculoskeletal: Negative for arthralgias and back pain.  Skin: Negative for color change and rash.  Neurological: Negative for seizures and syncope.  All other systems reviewed and are negative.    Physical Exam Triage Vital Signs ED Triage Vitals [11/26/18 1010]  Enc Vitals Group     BP (!) 149/90     Pulse Rate 69     Resp 18     Temp 98.9 F (37.2 C)     Temp Source Oral  SpO2 96 %     Weight      Height      Head Circumference      Peak Flow      Pain Score 4     Pain Loc      Pain Edu?      Excl. in [place]?    No data found.  Updated Vital Signs BP (!) 149/90 (BP Location: Left Arm)   Pulse 69   Temp 98.9 F (37.2 C) (Oral)   Resp 18   SpO2 96%   Visual Acuity Right Eye Distance:   Left Eye Distance:   Bilateral Distance:    Right Eye Near:   Left Eye Near:    Bilateral Near:     Physical Exam Constitutional:      General: He is not in acute distress.    Appearance: He is well-developed.  HENT:     Head: Normocephalic and atraumatic.     Right Ear: Tympanic membrane and ear canal normal.     Left Ear: Tympanic membrane and ear canal normal.     Nose: Rhinorrhea present.     Comments: Clear  rhinorrhea    Mouth/Throat:     Mouth: Mucous membranes are moist.     Comments: Mild posterior pharyngeal erythema Eyes:     Conjunctiva/sclera: Conjunctivae normal.     Pupils: Pupils are equal, round, and reactive to light.  Neck:     Musculoskeletal: Normal range of motion.  Cardiovascular:     Rate and Rhythm: Normal rate and regular rhythm.     Heart sounds: Normal heart sounds.  Pulmonary:     Effort: Pulmonary effort is normal. No respiratory distress.     Breath sounds: Normal breath sounds.  Abdominal:     General: There is no distension.     Palpations: Abdomen is soft.  Musculoskeletal: Normal range of motion.  Skin:    General: Skin is warm and dry.  Neurological:     Mental Status: He is alert.  Psychiatric:        Mood and Affect: Mood normal.      UC Treatments / Results  Labs (all labs ordered are listed, but only abnormal results are displayed) Labs Reviewed - No data to display  EKG None  Radiology No results found.  Procedures Procedures (including critical care time)  Medications Ordered in UC Medications - No data to display  Initial Impression / Assessment and Plan / UC Course  I have reviewed the triage vital signs and the nursing notes.  Pertinent labs & imaging results that were available during my care of the patient were reviewed by me and considered in my medical decision making (see chart for details).     Reviewed risk and benefits of Tamiflu use.  Patient desires a prescription. Final Clinical Impressions(s) / UC Diagnoses   Final diagnoses:  Influenza-like illness     Discharge Instructions     Rest Push fluids OTC cough and cold medicine Tamiflu 2 x a day   ED Prescriptions    Medication Sig Dispense Auth. Provider   oseltamivir (TAMIFLU) 75 MG capsule Take 1 capsule (75 mg total) by mouth every 12 (twelve) hours. 10 capsule Eustace Moore, MD   ondansetron (ZOFRAN) 4 MG tablet Take 1 tablet (4 mg total) by  mouth every 6 (six) hours. 12 tablet Eustace Moore, MD     Controlled Substance Prescriptions [place] Controlled Substance Registry consulted? Not Applicable  Eustace Moore, MD 11/26/18 1027

## 2019-02-12 ENCOUNTER — Ambulatory Visit (INDEPENDENT_AMBULATORY_CARE_PROVIDER_SITE_OTHER): Payer: BLUE CROSS/BLUE SHIELD | Admitting: Family Medicine

## 2019-02-12 ENCOUNTER — Other Ambulatory Visit: Payer: Self-pay

## 2019-02-12 ENCOUNTER — Encounter (INDEPENDENT_AMBULATORY_CARE_PROVIDER_SITE_OTHER): Payer: Self-pay | Admitting: Family Medicine

## 2019-02-12 ENCOUNTER — Ambulatory Visit (INDEPENDENT_AMBULATORY_CARE_PROVIDER_SITE_OTHER): Payer: Self-pay

## 2019-02-12 DIAGNOSIS — M25512 Pain in left shoulder: Secondary | ICD-10-CM

## 2019-02-12 DIAGNOSIS — M79645 Pain in left finger(s): Secondary | ICD-10-CM

## 2019-02-12 DIAGNOSIS — G8929 Other chronic pain: Secondary | ICD-10-CM | POA: Diagnosis not present

## 2019-02-12 MED ORDER — GLUCOSAMINE SULFATE 1000 MG PO CAPS: 1.0000 | ORAL_CAPSULE | Freq: Two times a day (BID) | ORAL | 3 refills | Status: DC

## 2019-02-12 MED ORDER — ETODOLAC 400 MG PO TABS: 400.0000 mg | ORAL_TABLET | Freq: Two times a day (BID) | ORAL | 3 refills | Status: DC | PRN

## 2019-02-12 NOTE — Patient Instructions (Signed)
    Glucosamine Sulfate:  1,000 mg twice daily  Vitamin D3:  5,000 IU daily    

## 2019-02-12 NOTE — Progress Notes (Signed)
Office Visit Note   Patient: Kyle Butler           Date of Birth: 12-19-1975           MRN: [id number] Visit Date: 02/12/2019 Requested by: No referring provider defined for this encounter. PCP: Patient, No Pcp Per  Subjective: Chief Complaint  Patient presents with  . Left Hand - Pain    Pain in the left thumb after jamming thumb 1 month ago. Right-hand dominant.  . Left Shoulder - Pain    Decreased ROM and pain in the left shoulder x 6 months. Wakes him from sleep. H/o shoulder surgery approximately 17 years ago - recurrent dislocations.    HPI: He is here with left shoulder and left thumb pain.  He is right-hand dominant.  About a month ago at work, he jammed his left thumb.  It was very painful but he did not seek immediate treatment.  It has continued to bother him with certain movements, pain at the base of the thumb.  He is status post left shoulder surgery for recurrent dislocations about 17 years ago.  He did well until about a year ago when he started noticing intermittent pain when reaching overhead.  For the past 6 months it has been much worse, waking him at night.  Pain is on top of the shoulder.  Denies any numbness or tingling.              ROS: No fevers or chills, no respiratory symptoms.  All other systems were reviewed and are negative.  Objective: Vital Signs: There were no vitals taken for this visit.  Physical Exam:  General:  Alert and oriented, in no acute distress. Pulm:  Breathing unlabored. Psy:  Normal mood, congruent affect. Skin: No rash on his skin. Left shoulder: Full active range of motion compared to the right, no adhesive capsulitis.  Point tender at the [facility] joint and this seems to reproduce his pain.  No tenderness in the subacromial space.  Isometric rotator cuff strength is 5/5 throughout with minimal pain.  Speeds test is negative, supination of the forearm against resistance does not cause pain. Left hand: No swelling, full range of  motion of thumb.  Lourena Simmonds test is negative.  Tender to palpation at the [facility] joint.  No pain at the MCP joint and no laxity of the collateral ligaments.  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  Humeral head is low in the socket.  There is no surgical hardware in the shoulder.  X-rays left thumb: Moderate CMC joint arthritis present.  No sign of fracture.    Assessment & Plan: 1.  Left thumb pain, suspect sprain of CMC joint with underlying arthritis. -Occupational therapy for a custom splint.  Glucosamine sulfate.  Consider hand therapy if symptoms persist, cortisone injection if ongoing pain.  Trial of Lodine for inflammation.  2.  Left shoulder pain suspect due to [facility] joint arthropathy - Lodine for inflammation, glucosamine, rotator cuff strengthening exercises for stability.  We will image with MRI scan if symptoms persist.     Procedures: No procedures performed  No notes on file     PMFS History: Patient Active Problem List   Diagnosis Date Noted  . Routine general medical examination at a health care facility 10/12/2016  . Obesity 10/12/2016  . Rapid palpitations 09/06/2013    Class: Acute  . Anxiety   .  Depression    Past Medical History:  Diagnosis Date  . ADHD   . Anxiety   . Depression   . History of chicken pox     Family History  Problem Relation Age of Onset  . Dementia Mother   . Parkinson's disease Father     Past Surgical History:  Procedure Laterality Date  . ANKLE SURGERY     RIGHT  . FRACTURE SURGERY    . SHOULDER SURGERY     LEFT  . TONSILLECTOMY     Social History   Occupational History  . Occupation: Welder  Tobacco Use  . Smoking status: Never Smoker  . Smokeless tobacco: Never Used  Substance and Sexual Activity  . Alcohol use: No  . Drug use: No  . Sexual activity: Yes

## 2019-12-11 ENCOUNTER — Ambulatory Visit
Admission: EM | Admit: 2019-12-11 | Discharge: 2019-12-11 | Disposition: A | Discharge status: Home / Self Care | Payer: BC Managed Care – PPO | Attending: Physician Assistant | Admitting: Physician Assistant

## 2019-12-11 ENCOUNTER — Ambulatory Visit (INDEPENDENT_AMBULATORY_CARE_PROVIDER_SITE_OTHER): Payer: BC Managed Care – PPO

## 2019-12-11 ENCOUNTER — Other Ambulatory Visit: Payer: Self-pay

## 2019-12-11 DIAGNOSIS — R05 Cough: Secondary | ICD-10-CM | POA: Diagnosis not present

## 2019-12-11 DIAGNOSIS — U071 COVID-19: Secondary | ICD-10-CM | POA: Diagnosis not present

## 2019-12-11 MED ORDER — PROMETHAZINE-DM 6.25-15 MG/5ML PO SYRP: 5.0000 mL | ORAL_SOLUTION | Freq: Four times a day (QID) | ORAL | 0 refills | Status: DC | PRN

## 2019-12-11 MED ORDER — ALBUTEROL SULFATE HFA 108 (90 BASE) MCG/ACT IN AERS: 1.0000 | INHALATION_SPRAY | Freq: Four times a day (QID) | RESPIRATORY_TRACT | 0 refills | Status: DC | PRN

## 2019-12-11 MED ORDER — BENZONATATE 100 MG PO CAPS: 100.0000 mg | ORAL_CAPSULE | Freq: Three times a day (TID) | ORAL | 0 refills | Status: DC

## 2019-12-11 NOTE — ED Triage Notes (Signed)
Patient presents for a CXR that was recommended by a provider at another Urgent Care who he saw initially and was diagnosed with COVID. He called that clinic today requesting a refill for cough syrup and was advised to come here.  He notes having a productive yellow cough that he states is not any worse since it started.

## 2019-12-11 NOTE — Discharge Instructions (Signed)
Your chest xray showed changes to bilateral low lungs, this is consistent with COVID infection. Given no fever, I do not think you need antibiotic at this time. We will continue cough medicine, and add inhaler as needed. Continue quarantine until after 10 days of symptom onset AND 24 hours of no fever with improvement of symptoms. If worsening symptoms please follow up for reevaluation.

## 2019-12-11 NOTE — ED Provider Notes (Signed)
[facility]    CSN: [id number] Arrival date & time: 12/11/19  1822      History   Chief Complaint Chief Complaint  Patient presents with  . Cough    COVID positive 1/20    HPI Kyle Butler is a 44 y.o. male.   44 year old male comes in for continued symptoms after being diagnosed with COVID 12/04/2019. Symptoms started 12/03/2019 with cough, congestion, rhinorrhea, shortness of breath. Was seen at another urgent care on 12/04/2019, and was tested positive for COVID. He was provided promethazine and mucinex with temporary relief of symptoms. He states has mild improvement of symptoms. Called the urgent care for refill of cough syrup and was told to come to [place] for CXR. His shortness of breath is intermittent, worse with ambulation, though has not affected is daily activity. Denies fever, chills, body aches. Denies abdominal pain, nausea, vomiting, diarrhea. Denies loss of taste/smell. Never nicotine smoker. Does smoke THC daily for the past 30 years.      Past Medical History:  Diagnosis Date  . ADHD   . Anxiety   . Depression   . History of chicken pox     Patient Active Problem List   Diagnosis Date Noted  . Routine general medical examination at a health care facility 10/12/2016  . Obesity 10/12/2016  . Rapid palpitations 09/06/2013    Class: Acute  . Anxiety   . Depression     Past Surgical History:  Procedure Laterality Date  . ANKLE SURGERY     RIGHT  . FRACTURE SURGERY    . SHOULDER SURGERY     LEFT  . TONSILLECTOMY       Home Medications    Prior to Admission medications   Medication Sig Start Date End Date Taking? Authorizing Provider  albuterol (VENTOLIN HFA) 108 (90 Base) MCG/ACT inhaler Inhale 1-2 puffs into the lungs every 6 (six) hours as needed for wheezing or shortness of breath. 12/11/19   Cathie Hoops,  V, PA-C  ALPRAZolam Prudy Feeler) 1 MG tablet Take 1 mg by mouth 2 (two) times daily.    [provider]   amphetamine-dextroamphetamine (ADDERALL) 15 MG tablet Take 15 mg by mouth daily.    [provider]  benzonatate (TESSALON) 100 MG capsule Take 1-2 capsules (100-200 mg total) by mouth every 8 (eight) hours. 12/11/19   Belinda Fisher, PA-C  etodolac (LODINE) 400 MG tablet Take 1 tablet (400 mg total) by mouth 2 (two) times daily as needed. 02/12/19   Hilts, Casimiro Needle, MD  promethazine-dextromethorphan (PROMETHAZINE-DM) 6.25-15 MG/5ML syrup Take 5 mLs by mouth 4 (four) times daily as needed for cough. 12/11/19   Belinda Fisher, PA-C    Family History Family History  Problem Relation Age of Onset  . Dementia Mother   . Parkinson's disease Father     Social History Social History   Tobacco Use  . Smoking status: Never Smoker  . Smokeless tobacco: Never Used  Substance Use Topics  . Alcohol use: No  . Drug use: Yes    Types: Marijuana    Allergies   Patient has no known allergies.   Review of Systems Review of Systems  Reason unable to perform ROS: See HPI as above.     Physical Exam Triage Vital Signs ED Triage Vitals  Enc Vitals Group     BP      Pulse      Resp      Temp  Temp src      SpO2      Weight      Height      Head Circumference      Peak Flow      Pain Score      Pain Loc      Pain Edu?      Excl. in [place]?    No data found.  Updated Vital Signs BP (!) 143/87 (BP Location: Left Arm)   Pulse 69   Temp (!) 97.3 F (36.3 C) (Temporal)   Resp 16   SpO2 97%   Physical Exam Constitutional:      General: He is not in acute distress.    Appearance: Normal appearance. He is not ill-appearing, toxic-appearing or diaphoretic.  HENT:     Head: Normocephalic and atraumatic.     Mouth/Throat:     Mouth: Mucous membranes are moist.     Pharynx: Oropharynx is clear. Uvula midline.  Cardiovascular:     Rate and Rhythm: Normal rate and regular rhythm.     Heart sounds: Normal heart sounds. No murmur. No friction rub. No gallop.   Pulmonary:     Effort:  Pulmonary effort is normal. No accessory muscle usage, prolonged expiration, respiratory distress or retractions.     Comments: Speaking in full sentences without difficulty. Audible wheezing that resolves with cough. Rhonchi that resolved with cough. No other adventitious lung sounds.  Musculoskeletal:     Cervical back: Normal range of motion and neck supple.  Neurological:     General: No focal deficit present.     Mental Status: He is alert and oriented to person, place, and time.    UC Treatments / Results  Labs (all labs ordered are listed, but only abnormal results are displayed) Labs Reviewed - No data to display  EKG   Radiology DG Chest 2 View  Result Date: 12/11/2019 CLINICAL DATA:  Cough history of COVID EXAM: CHEST - 2 VIEW COMPARISON:  12/14/2009 FINDINGS: Bilateral increased interstitial opacity. No consolidation or effusion. Normal heart size. No pneumothorax. IMPRESSION: Increased interstitial opacity bilaterally, could be secondary to atypical infection. Negative for focal airspace disease. Electronically Signed   By: Donavan Foil M.D.   On: 12/11/2019 18:56    Procedures Procedures (including critical care time)  Medications Ordered in UC Medications - No data to display  Initial Impression / Assessment and Plan / UC Course  I have reviewed the triage vital signs and the nursing notes.  Pertinent labs & imaging results that were available during my care of the patient were reviewed by me and considered in my medical decision making (see chart for details).    CXR with bilateral increased interstitial opacity. Discussed this is consistent with COVID. Patient without fever, worsening symptoms, hypoxia, will avoid abx use at this time. If developing worsening symptoms, fever, worsening shortness of breath, may need to consider abx use. Symptomatic treatment discussed. Push fluids. Updated quarantine instructions given. Return precautions given. Patient expresses  understanding and agrees to plan.  Final Clinical Impressions(s) / UC Diagnoses   Final diagnoses:  [id number]   ED Prescriptions    Medication Sig Dispense Auth. Provider   promethazine-dextromethorphan (PROMETHAZINE-DM) 6.25-15 MG/5ML syrup Take 5 mLs by mouth 4 (four) times daily as needed for cough. 118 mL ,  V, PA-C   benzonatate (TESSALON) 100 MG capsule Take 1-2 capsules (100-200 mg total) by mouth every 8 (eight) hours. 30 capsule Tasia Catchings,  V, PA-C   albuterol (  VENTOLIN HFA) 108 (90 Base) MCG/ACT inhaler Inhale 1-2 puffs into the lungs every 6 (six) hours as needed for wheezing or shortness of breath. 8 g Belinda Fisher, PA-C     I have reviewed the PDMP during this encounter.   Belinda Fisher, PA-C 12/11/19 1935

## 2020-12-16 ENCOUNTER — Encounter: Payer: Self-pay | Admitting: Family Medicine

## 2020-12-22 ENCOUNTER — Encounter: Payer: Self-pay | Admitting: Family Medicine

## 2020-12-23 ENCOUNTER — Ambulatory Visit: Payer: Self-pay

## 2020-12-23 ENCOUNTER — Other Ambulatory Visit: Payer: Self-pay

## 2020-12-23 ENCOUNTER — Ambulatory Visit: Payer: BC Managed Care – PPO | Admitting: Family Medicine

## 2020-12-23 DIAGNOSIS — R0781 Pleurodynia: Secondary | ICD-10-CM | POA: Diagnosis not present

## 2020-12-23 DIAGNOSIS — L723 Sebaceous cyst: Secondary | ICD-10-CM | POA: Diagnosis not present

## 2020-12-23 DIAGNOSIS — G8929 Other chronic pain: Secondary | ICD-10-CM | POA: Diagnosis not present

## 2020-12-23 DIAGNOSIS — M25512 Pain in left shoulder: Secondary | ICD-10-CM

## 2020-12-23 MED ORDER — HYDROCODONE-ACETAMINOPHEN 5-325 MG PO TABS: 1.0000 | ORAL_TABLET | Freq: Every evening | ORAL | 0 refills | Status: DC | PRN

## 2020-12-23 NOTE — Progress Notes (Signed)
Office Visit Note   Patient: Kyle Butler           Date of Birth: May 07, 1976           MRN: [id number] Visit Date: 12/23/2020 Requested by: No referring provider defined for this encounter. PCP: Patient, No Pcp Per  Subjective: Chief Complaint  Patient presents with  . Left Shoulder - Pain    H/o right shoulder surgery 19 years ago. Has had pain in the shoulder x 5-6 years, but he thinks he hurt it again when he fell a week ago (also hurt a rib on the left). He feels like the shoulder will fall out of place again, like it did prior to his surgery.  . Other    Cyst on top of head x years. Has gotten bigger and the patient "has been messing with it."    HPI: He is here with multiple issues.  He fell and hurt his left rib cage about a week ago.  Still having severe pain, especially at night.  Ongoing problems with his left shoulder.  History of reconstruction 19 years ago.  It feels like it is coming out again with certain movements.  He does a lot of lifting at work.  He is right-hand dominant.  He has a sebaceous cyst on his scalp similar to what was drained by [place] years ago.  It has become very sensitive lately and he would like to have it drained again.                ROS:   All other systems were reviewed and are negative.  Objective: Vital Signs: There were no vitals taken for this visit.  Physical Exam:  General:  Alert and oriented, in no acute distress. Pulm:  Breathing unlabored. Psy:  Normal mood, congruent affect. Skin: There is a sebaceous cyst on the posterior aspect of his scalp.  It does not appear infected. Left shoulder: He has pain reaching overhead.  He has pain with empty can test.  He has positive sulcus sign. Ribs: Very tender around the anterior mid axillary line proximately fifth rib.    Imaging: XR Ribs Unilateral Left  Result Date: 12/23/2020 X-rays left ribs do not show definite fracture.  No pneumothorax.  XR Shoulder Left  Result Date:  12/23/2020 X-rays left shoulder show inferior subluxation of the glenohumeral joint, similar to prior x-rays.   Assessment & Plan: 1.  Probable occult left rib fracture -Ace bandage for comfort, hydrocodone at night as needed.  Out of work until Monday.  Anticipate 6 to 12 weeks healing time.  2.  Left shoulder instability -Once healed from his rib injury, he will contact me for a physical therapy referral.  3.  Scalp epidermal inclusion cyst -Elected to remove it today.     Procedures: Scalp cyst removal: After sterile prep with Betadine, injected 2 cc 1% lidocaine with epinephrine then using an 11 blade, made a 1 cm linear incision, then was able to remove most of the cyst capsule.  There was no purulent drainage.  The wound was closed with 1 4-0 nylon suture.       PMFS History: Patient Active Problem List   Diagnosis Date Noted  . Routine general medical examination at a health care facility 10/12/2016  . Obesity 10/12/2016  . Rapid palpitations 09/06/2013    Class: Acute  . Anxiety   . Depression    Past Medical History:  Diagnosis Date  . ADHD   .  Anxiety   . Depression   . History of chicken pox     Family History  Problem Relation Age of Onset  . Dementia Mother   . Parkinson's disease Father     Past Surgical History:  Procedure Laterality Date  . ANKLE SURGERY     RIGHT  . FRACTURE SURGERY    . SHOULDER SURGERY     LEFT  . TONSILLECTOMY     Social History   Occupational History  . Occupation: Welder  Tobacco Use  . Smoking status: Never Smoker  . Smokeless tobacco: Never Used  Substance and Sexual Activity  . Alcohol use: No  . Drug use: Yes    Types: Marijuana  . Sexual activity: Yes

## 2020-12-29 ENCOUNTER — Encounter: Payer: Self-pay | Admitting: Family Medicine

## 2020-12-30 ENCOUNTER — Ambulatory Visit (INDEPENDENT_AMBULATORY_CARE_PROVIDER_SITE_OTHER): Payer: BC Managed Care – PPO

## 2020-12-30 ENCOUNTER — Other Ambulatory Visit: Payer: Self-pay | Admitting: Family Medicine

## 2020-12-30 ENCOUNTER — Other Ambulatory Visit: Payer: Self-pay

## 2020-12-30 DIAGNOSIS — L723 Sebaceous cyst: Secondary | ICD-10-CM

## 2020-12-30 MED ORDER — HYDROCODONE-ACETAMINOPHEN 5-325 MG PO TABS: 1.0000 | ORAL_TABLET | Freq: Every evening | ORAL | 0 refills | Status: DC | PRN

## 2020-12-30 NOTE — Progress Notes (Signed)
Nurse visit: Removed 1 suture from the patient's head. The site looked good, without any redness nor drainage. Some slight swelling. Dr. Marga Hoots also checked the site. Advised the patient to give it some more days to a week and see if that swelling subsides. He is to let us know if that is not the case.

## 2021-04-27 ENCOUNTER — Ambulatory Visit: Payer: BC Managed Care – PPO | Admitting: Family Medicine

## 2021-04-28 ENCOUNTER — Other Ambulatory Visit: Payer: Self-pay

## 2021-04-28 ENCOUNTER — Ambulatory Visit (INDEPENDENT_AMBULATORY_CARE_PROVIDER_SITE_OTHER): Payer: BC Managed Care – PPO | Admitting: Family Medicine

## 2021-04-28 ENCOUNTER — Encounter: Payer: Self-pay | Admitting: Family Medicine

## 2021-04-28 ENCOUNTER — Ambulatory Visit: Payer: Self-pay

## 2021-04-28 DIAGNOSIS — M79672 Pain in left foot: Secondary | ICD-10-CM | POA: Diagnosis not present

## 2021-04-28 DIAGNOSIS — L720 Epidermal cyst: Secondary | ICD-10-CM | POA: Diagnosis not present

## 2021-04-28 NOTE — Progress Notes (Signed)
Office Visit Note   Patient: Kyle Butler           Date of Birth: 1976/06/24           MRN: [id number] Visit Date: 04/28/2021 Requested by: No referring provider defined for this encounter. PCP: Patient, No Pcp Per (Inactive)  Subjective: Chief Complaint  Patient presents with   Left Foot - Pain    Pain in lateral foot since Wednesday of last week. Unsure of any specific injury. Hurts to wear a shoe. Hurts for anything to touch that area. Has been red and warm.    HPI: 45yo M presenting to clinic with one week of lateral left foot pain. Patient denies any inciting trauma or change in activity, but states the lateral aspect of his 5th MTP Joint is extremely tender to the touch. He noticed swelling, and redness of the skin, though no fevers or chills. He denies walking barefoot, no history of gout. He hasn't noticed any drainage from the area. No other toes are affected. He has tried buying wider boots, which helps somewhat, as he is in severe discomfort when his foot rubs against the toe-box of his shoe.   Additionally, he has a small lump on his back which has been present for 8 years. He says his grandfather had a similar bump and 'Then he died 3 months later!' Patient states he is very anxious about this mass, though he is also extremely self-conscious about it, and would like it gone.             Objective: Vital Signs: There were no vitals taken for this visit.  Physical Exam:  General:  Alert and oriented, in no acute distress. Pulm:  Breathing unlabored. Psy:  Normal mood, congruent affect. Skin:  1cm diameter firm superficial mass in left posterior chest wall, which is non-tender to palpation. Central pore visible. No overlying erythema.   Left foot with two small corn/callous formations overlying the lateral aspect of the 5th MTP. These are very tender to palpation, and do not interrupt skin lines. No thrombosed capillaries upon scraping.  No other lesions appreciated.    Imaging: Left Foot with no obvious bony abnormalities or obvious foreign bodies.   Assessment & Plan: 45yo M presenting to clinic with left lateral foot pain, with what appears to be two corns overlying the 5th MTP. Area was scraped with scalpel, which did not reveal warty capillaries, though he did endorse immediate improvement in his pain after callous removal. - Discussed buying corn pads, to help reduce friction and encourage healing.  Per his skin mass, area is consistent with epidermal inclusion cyst. Reassurance given that this is a benign lesion. Due to patient's desire for removal, lesion was expressed as described below. Aftercare and return precautions were discussed.      Procedures: Epidermal Cyst  Area was numped with 1% lidocaine, and small puncture was made with 18G needle. Copious amounts of thick sebum was expressed by hand. Area was covered in a bandaid, and aftercare was discussed.   Skin Parring Left foot corny lesions were parred with scalpel until sensation was appreciated by patient.   PMFS History: Patient Active Problem List   Diagnosis Date Noted   Routine general medical examination at a health care facility 10/12/2016   Obesity 10/12/2016   Rapid palpitations 09/06/2013    Class: Acute   Anxiety    Depression    Past Medical History:  Diagnosis Date   ADHD  Anxiety    Depression    History of chicken pox     Family History  Problem Relation Age of Onset   Dementia Mother    Parkinson's disease Father     Past Surgical History:  Procedure Laterality Date   ANKLE SURGERY     RIGHT   FRACTURE SURGERY     SHOULDER SURGERY     LEFT   TONSILLECTOMY     Social History   Occupational History   Occupation: Psychologist, occupational  Tobacco Use   Smoking status: Never   Smokeless tobacco: Never  Substance and Sexual Activity   Alcohol use: No   Drug use: Yes    Types: Marijuana   Sexual activity: Yes

## 2021-04-28 NOTE — Progress Notes (Signed)
I saw and examined the patient with Dr. Marga Hoots and agree with assessment and plan as outlined.    Left lateral foot pain since last week, no definite injury.  Pain when wearing shoes.  Exam revealed hypertrophic callus on the lateral aspect of the fifth MTP joint.  This was pared with a 15 blade with good relief of symptoms.  He also had an epidermal inclusion cyst in the left mid back which was expressed today.

## 2021-04-30 ENCOUNTER — Encounter: Payer: Self-pay | Admitting: Family Medicine

## 2021-05-20 ENCOUNTER — Other Ambulatory Visit: Payer: Self-pay | Admitting: Family Medicine

## 2021-05-20 ENCOUNTER — Telehealth: Payer: Self-pay | Admitting: Family Medicine

## 2021-05-20 DIAGNOSIS — G8929 Other chronic pain: Secondary | ICD-10-CM

## 2021-05-20 NOTE — Telephone Encounter (Signed)
I called the patient. He said it feels like a screw is sticking up in the anterior shoulder, since he tried to catch that box at work this morning. I gave him the number to [facility] and he said he will call them to get scheduled. The patient did ask if there is a way to work him in tomorrow here (full schedule, as of this afternoon). 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 He has tried ice, but asked if heat is better -- advised him to use whichever feels better - may alternate the 2. He put a sling on, which relieves some of the pressure.   Please advise on working the patient in and work note.

## 2021-05-20 NOTE — Telephone Encounter (Signed)
Pt called stating DR. Hilts had recommended a shoulder surgery awhile back and he had put it ff; but the pt was at work today trying to catch a box and heard a pop in his shoulder. Pt would like a CB from Dr. Prince Rome to discuss what he should probably do next.   [phone]

## 2021-05-20 NOTE — Telephone Encounter (Signed)
Per your last note, you were going to send the patient to PT if no improvement in the shoulder pain. Please advise.

## 2021-05-21 NOTE — Telephone Encounter (Signed)
I called and spoke with the patient: he is feeling some better today, after applying heating pad, doing some of the exercises he was taught post-surgery years ago, and soaking in a hot tub yesterday evening. He has called [facility] PT and has an appointment with them on 06/02/21 at 3:00. Work note written. His wife will pick it up around 4.

## 2021-06-07 ENCOUNTER — Other Ambulatory Visit: Payer: Self-pay | Admitting: Family Medicine

## 2021-06-07 DIAGNOSIS — G8929 Other chronic pain: Secondary | ICD-10-CM

## 2021-06-07 NOTE — Progress Notes (Signed)
Shoulder pain is worse with PT.  Will order MRI.

## 2021-06-20 ENCOUNTER — Ambulatory Visit
Admission: EM | Admit: 2021-06-20 | Discharge: 2021-06-20 | Disposition: A | Discharge status: Home / Self Care | Payer: BC Managed Care – PPO | Attending: Urgent Care | Admitting: Urgent Care

## 2021-06-20 DIAGNOSIS — R0602 Shortness of breath: Secondary | ICD-10-CM

## 2021-06-20 DIAGNOSIS — B9789 Other viral agents as the cause of diseases classified elsewhere: Secondary | ICD-10-CM | POA: Diagnosis not present

## 2021-06-20 DIAGNOSIS — Z20822 Contact with and (suspected) exposure to covid-19: Secondary | ICD-10-CM

## 2021-06-20 DIAGNOSIS — J988 Other specified respiratory disorders: Secondary | ICD-10-CM

## 2021-06-20 MED ORDER — CETIRIZINE HCL 10 MG PO TABS: 10.0000 mg | ORAL_TABLET | Freq: Every day | ORAL | 0 refills | Status: AC

## 2021-06-20 MED ORDER — PROMETHAZINE-DM 6.25-15 MG/5ML PO SYRP: 5.0000 mL | ORAL_SOLUTION | Freq: Every evening | ORAL | 0 refills | Status: AC | PRN

## 2021-06-20 MED ORDER — PSEUDOEPHEDRINE HCL 60 MG PO TABS: 60.0000 mg | ORAL_TABLET | Freq: Three times a day (TID) | ORAL | 0 refills | Status: AC | PRN

## 2021-06-20 NOTE — ED Provider Notes (Signed)
[facility]   MRN: [id number] DOB: 1975/11/24  Subjective:   Kyle Butler is a 45 y.o. male presenting for 5-day history of acute onset persistent malaise and fatigue, chills, fever, throat pain, shortness of breath, sinus pressure and congestion, intermittent mild cough, body aches.  Patient has had COVID-19 twice and feels like this is the same problem again.  Last infection was 8 months ago.  He is not COVID vaccinated.  Has been traveling and out of public a lot.  Denies history of asthma, COPD.  Patient smokes marijuana.  No cigarette smoking.  No current facility-administered medications for this encounter.  Current Outpatient Medications:    ALPRAZolam (XANAX) 1 MG tablet, Take 1 mg by mouth 2 (two) times daily., Disp: , Rfl:    No Known Allergies  Past Medical History:  Diagnosis Date   ADHD    Anxiety    Depression    History of chicken pox      Past Surgical History:  Procedure Laterality Date   ANKLE SURGERY     RIGHT   FRACTURE SURGERY     SHOULDER SURGERY     LEFT   TONSILLECTOMY      Family History  Problem Relation Age of Onset   Dementia Mother    Parkinson's disease Father     Social History   Tobacco Use   Smoking status: Never   Smokeless tobacco: Never  Substance Use Topics   Alcohol use: No   Drug use: Yes    Types: Marijuana    ROS   Objective:   Vitals: BP (!) 143/92 (BP Location: Left Arm)   Pulse 71   Temp 98.4 F (36.9 C) (Oral)   Resp 18   SpO2 97%   Physical Exam Constitutional:      General: He is not in acute distress.    Appearance: Normal appearance. He is well-developed and normal weight. He is not ill-appearing, toxic-appearing or diaphoretic.  HENT:     Head: Normocephalic and atraumatic.     Right Ear: Tympanic membrane, ear canal and external ear normal. There is no impacted cerumen.     Left Ear: Tympanic membrane, ear canal and external ear normal. There is no impacted cerumen.     Nose:  Nose normal. No congestion or rhinorrhea.     Mouth/Throat:     Mouth: Mucous membranes are moist.     Pharynx: No oropharyngeal exudate or posterior oropharyngeal erythema.  Eyes:     General: No scleral icterus.       Right eye: No discharge.        Left eye: No discharge.     Extraocular Movements: Extraocular movements intact.     Conjunctiva/sclera: Conjunctivae normal.     Pupils: Pupils are equal, round, and reactive to light.  Cardiovascular:     Rate and Rhythm: Normal rate and regular rhythm.     Heart sounds: Normal heart sounds. No murmur heard.   No friction rub. No gallop.  Pulmonary:     Effort: Pulmonary effort is normal. No respiratory distress.     Breath sounds: Normal breath sounds. No stridor. No wheezing, rhonchi or rales.  Musculoskeletal:     Cervical back: Normal range of motion and neck supple. No rigidity. No muscular tenderness.  Neurological:     General: No focal deficit present.     Mental Status: He is alert and oriented to person, place, and time.  Psychiatric:  Mood and Affect: Mood normal.        Behavior: Behavior normal.        Thought Content: Thought content normal.      Assessment and Plan :   PDMP not reviewed this encounter.  1. Viral respiratory illness   2. Close exposure to COVID-19 virus   3. Shortness of breath     Symptoms highly suspicious for COVID-19 especially given his recent travel and vacationing.  COVID-19 testing pending, recommend supportive care. Counseled patient on potential for adverse effects with medications prescribed/recommended today, ER and return-to-clinic precautions discussed, patient verbalized understanding.    Wallis Bamberg, [place] 06/20/21 1644

## 2021-06-20 NOTE — Discharge Instructions (Addendum)

## 2021-06-20 NOTE — ED Triage Notes (Signed)
Four day h/o chills, fever, sore throat, dysphagia, sinus pressure, body aches and intermittent cough. Pt went to the beach this weekend and notes that he pushed himself to engage in activities and today he feels worse.  Has been taking OTC cough meds without relief.

## 2021-06-21 LAB — NOVEL CORONAVIRUS, NAA: SARS-CoV-2, NAA: NOT DETECTED

## 2021-06-21 LAB — SARS-COV-2, NAA 2 DAY TAT

## 2021-06-22 ENCOUNTER — Encounter: Payer: Self-pay | Admitting: Emergency Medicine

## 2021-06-22 ENCOUNTER — Ambulatory Visit
Admission: EM | Admit: 2021-06-22 | Discharge: 2021-06-22 | Disposition: A | Discharge status: Home / Self Care | Payer: BC Managed Care – PPO | Attending: Internal Medicine | Admitting: Internal Medicine

## 2021-06-22 DIAGNOSIS — J029 Acute pharyngitis, unspecified: Secondary | ICD-10-CM | POA: Insufficient documentation

## 2021-06-22 DIAGNOSIS — R509 Fever, unspecified: Secondary | ICD-10-CM | POA: Insufficient documentation

## 2021-06-22 DIAGNOSIS — J069 Acute upper respiratory infection, unspecified: Secondary | ICD-10-CM | POA: Diagnosis not present

## 2021-06-22 LAB — POCT RAPID STREP A (OFFICE): Rapid Strep A Screen: NEGATIVE

## 2021-06-22 MED ORDER — AMOXICILLIN 875 MG PO TABS: 875.0000 mg | ORAL_TABLET | Freq: Two times a day (BID) | ORAL | 0 refills | Status: AC

## 2021-06-22 NOTE — Discharge Instructions (Signed)
You are being treated for upper respiratory infection and sore throat with amoxicillin antibiotic.  You may continue over-the-counter medications and medications previously prescribed at urgent care.  Please follow-up if symptoms persist or do not improve.

## 2021-06-22 NOTE — ED Triage Notes (Signed)
Patient c/o nasal congestion, diarrhea, runny nose, negative PCR COVID test, just no better.

## 2021-06-22 NOTE — ED Provider Notes (Signed)
[facility]    CSN: [id number] Arrival date & time: 06/22/21  1738      History   Chief Complaint Chief Complaint  Patient presents with   Nasal Congestion    HPI Kyle Butler is a 45 y.o. male.   Patient presents to the urgent care for further evaluation for nasal congestion, runny nose, sore throat, fever that has been present for approximately 1 week.  Was seen previously approximately 3 days ago for same symptoms.  Was prescribed cough medication, cetirizine, Sudafed to treat symptoms but states that symptoms did not improve.  T-max at home was 102.  Patient had negative PCR COVID test at previous visit.  Has not yet been tested for strep throat.  Denies any chest pain or shortness of breath.    Past Medical History:  Diagnosis Date   ADHD    Anxiety    Depression    History of chicken pox     Patient Active Problem List   Diagnosis Date Noted   Routine general medical examination at a health care facility 10/12/2016   Obesity 10/12/2016   Rapid palpitations 09/06/2013    Class: Acute   Anxiety    Depression     Past Surgical History:  Procedure Laterality Date   ANKLE SURGERY     RIGHT   FRACTURE SURGERY     SHOULDER SURGERY     LEFT   TONSILLECTOMY         Home Medications    Prior to Admission medications   Medication Sig Start Date End Date Taking? Authorizing Provider  ALPRAZolam Prudy Feeler) 1 MG tablet Take 1 mg by mouth 2 (two) times daily.   Yes [provider]  amoxicillin (AMOXIL) 875 MG tablet Take 1 tablet (875 mg total) by mouth 2 (two) times daily for 10 days. 06/22/21 07/02/21 Yes Lance Muss, FNP  cetirizine (ZYRTEC ALLERGY) 10 MG tablet Take 1 tablet (10 mg total) by mouth daily. 06/20/21  Yes Wallis Bamberg, PA-C  promethazine-dextromethorphan (PROMETHAZINE-DM) 6.25-15 MG/5ML syrup Take 5 mLs by mouth at bedtime as needed for cough. 06/20/21  Yes Wallis Bamberg, PA-C  pseudoephedrine (SUDAFED) 60 MG tablet Take 1 tablet  (60 mg total) by mouth every 8 (eight) hours as needed for congestion. 06/20/21  Yes Wallis Bamberg, PA-C    Family History Family History  Problem Relation Age of Onset   Dementia Mother    Parkinson's disease Father     Social History Social History   Tobacco Use   Smoking status: Never   Smokeless tobacco: Never  Substance Use Topics   Alcohol use: No   Drug use: Yes    Types: Marijuana     Allergies   Patient has no known allergies.   Review of Systems Review of Systems Per HPI  Physical Exam Triage Vital Signs ED Triage Vitals  Enc Vitals Group     BP 06/22/21 1824 127/87     Pulse Rate 06/22/21 1824 70     Resp --      Temp 06/22/21 1824 98.2 F (36.8 C)     Temp Source 06/22/21 1824 Oral     SpO2 06/22/21 1824 96 %     Weight 06/22/21 1826 245 lb (111.1 kg)     Height 06/22/21 1826 5\' 11"  (1.803 m)     Head Circumference --      Peak Flow --      Pain Score 06/22/21 1825 0  Pain Loc --      Pain Edu? --      Excl. in GC? --    No data found.  Updated Vital Signs BP 127/87 (BP Location: Left Arm)   Pulse 70   Temp 98.2 F (36.8 C) (Oral)   Ht 5\' 11"  (1.803 m)   Wt 245 lb (111.1 kg)   SpO2 96%   BMI 34.17 kg/m   Visual Acuity Right Eye Distance:   Left Eye Distance:   Bilateral Distance:    Right Eye Near:   Left Eye Near:    Bilateral Near:     Physical Exam Constitutional:      General: He is not in acute distress.    Appearance: Normal appearance.  HENT:     Head: Normocephalic and atraumatic.     Right Ear: Tympanic membrane and ear canal normal.     Left Ear: Tympanic membrane and ear canal normal.     Nose: Mucosal edema and congestion present.     Mouth/Throat:     Mouth: Mucous membranes are moist.     Pharynx: Posterior oropharyngeal erythema present.  Eyes:     Extraocular Movements: Extraocular movements intact.     Conjunctiva/sclera: Conjunctivae normal.     Pupils: Pupils are equal, round, and reactive to light.   Cardiovascular:     Rate and Rhythm: Normal rate and regular rhythm.     Pulses: Normal pulses.     Heart sounds: Normal heart sounds.  Pulmonary:     Effort: Pulmonary effort is normal. No respiratory distress.     Breath sounds: Normal breath sounds. No wheezing.  Abdominal:     General: Abdomen is flat. Bowel sounds are normal.     Palpations: Abdomen is soft.  Musculoskeletal:        General: Normal range of motion.     Cervical back: Normal range of motion.  Skin:    General: Skin is warm and dry.  Neurological:     General: No focal deficit present.     Mental Status: He is alert and oriented to person, place, and time. Mental status is at baseline.  Psychiatric:        Mood and Affect: Mood normal.        Behavior: Behavior normal.     UC Treatments / Results  Labs (all labs ordered are listed, but only abnormal results are displayed) Labs Reviewed  CULTURE, GROUP A STREP [facility])  POCT RAPID STREP A (OFFICE)    EKG   Radiology No results found.  Procedures Procedures (including critical care time)  Medications Ordered in UC Medications - No data to display  Initial Impression / Assessment and Plan / UC Course  I have reviewed the triage vital signs and the nursing notes.  Pertinent labs & imaging results that were available during my care of the patient were reviewed by me and considered in my medical decision making (see chart for details).     Will treat with amoxicillin antibiotic for upper respiratory infection due to duration of symptoms.  Strep test was negative in urgent care today.  Throat culture is pending.  COVID-19 test was negative at previous visit 3 days ago, so do not think retesting for COVID-19 at this time is necessary.  Fever monitoring and management discussed with patient.  Discussed over-the-counter medications to alleviate symptoms.Discussed strict return precautions. Patient verbalized understanding and is agreeable with plan.   Final Clinical Impressions(s) / UC Diagnoses  Final diagnoses:  Acute upper respiratory infection  Sore throat  Fever, unspecified     Discharge Instructions      You are being treated for upper respiratory infection and sore throat with amoxicillin antibiotic.  You may continue over-the-counter medications and medications previously prescribed at urgent care.  Please follow-up if symptoms persist or do not improve.     ED Prescriptions     Medication Sig Dispense Auth. Provider   amoxicillin (AMOXIL) 875 MG tablet Take 1 tablet (875 mg total) by mouth 2 (two) times daily for 10 days. 20 tablet Lance Muss, FNP      PDMP not reviewed this encounter.   Lance Muss, FNP 06/22/21 1929

## 2021-06-26 LAB — CULTURE, GROUP A STREP (THRC)

## 2021-07-02 ENCOUNTER — Telehealth: Payer: Self-pay | Admitting: Radiology

## 2021-07-02 NOTE — Telephone Encounter (Signed)
I called and got the voice mail -- left message for them to call me and let me know if he is coming at 11:00 or not.

## 2021-07-02 NOTE — Telephone Encounter (Signed)
Patient's wife, Mischa, called stating patient is complaining of right knee pain after hearing a "pop".  He is concerned that he has torn something in his knee and would like to see Dr. Prince Rome. I advised that Dr. Prince Rome is booked for today, but that I could make him an appointment for Monday afternoon. She asked if I would send a message up to Terri to see if she is able to work him in to the schedule for today.  Please advise. CB for Mischa is [phone]

## 2021-07-02 NOTE — Telephone Encounter (Signed)
I called back to try and work the patient in this afternoon, but the patient is unable to leave work. Scheduled an appointment for him for 3:40 on 07/05/21 with Dr. Prince Rome.

## 2021-07-02 NOTE — Telephone Encounter (Signed)
Please advise 

## 2021-07-05 ENCOUNTER — Encounter: Payer: Self-pay | Admitting: Family Medicine

## 2021-07-05 ENCOUNTER — Other Ambulatory Visit: Payer: Self-pay

## 2021-07-05 ENCOUNTER — Ambulatory Visit: Payer: BC Managed Care – PPO | Admitting: Family Medicine

## 2021-07-05 DIAGNOSIS — M2391 Unspecified internal derangement of right knee: Secondary | ICD-10-CM

## 2021-07-05 DIAGNOSIS — M25561 Pain in right knee: Secondary | ICD-10-CM | POA: Diagnosis not present

## 2021-07-05 MED ORDER — HYDROCODONE-ACETAMINOPHEN 5-325 MG PO TABS: 1.0000 | ORAL_TABLET | Freq: Four times a day (QID) | ORAL | 0 refills | Status: DC | PRN

## 2021-07-05 NOTE — Progress Notes (Signed)
   Office Visit Note   Patient: Kyle Butler           Date of Birth: 1976/07/02           MRN: [id number] Visit Date: 07/05/2021 Requested by: No referring provider defined for this encounter. PCP: Patient, No Pcp Per (Inactive)  Subjective: Chief Complaint  Patient presents with   Right Knee - Pain    Onset Thursday last week, pain started 1-2 months ago and all of the sudden he has been having increased pain, states that  he was doing better but then he went to get in a van this morning and something popped, now c/o pain radiating all the way down to the foot, making the ankle feel numb, pain is in the back of the leg.     HPI: He is here with right knee pain.  This past Thursday sudden onset of pain after the knee popped loudly.  It hurt quite a bit for several hours, then the pain improved.  The pain did not go away completely and today it popped again when getting into a van.  Now he can hardly move it.  It is constantly throbbing with pain all the way down to the foot.                ROS:   All other systems were reviewed and are negative.  Objective: Vital Signs: There were no vitals taken for this visit.  Physical Exam:  General:  Alert and oriented, in no acute distress. Pulm:  Breathing unlabored. Psy:  Normal mood, congruent affect.  Right knee: 1-2+ effusion with no warmth or erythema.  He is tender on the medial joint line and has pain but no definite click with McMurray's.  ACL feels intact.  He is tender in the popliteal fossa but no palpable cyst.   Imaging: No results found.  Assessment & Plan: Right knee pain and locking, concerning for bucket-handle meniscus tear -We will proceed with x-rays and MRI scan.  Surgical consult with Dr. August Saucer after the MRI.     Procedures: No procedures performed        PMFS History: Patient Active Problem List   Diagnosis Date Noted   Routine general medical examination at a health care facility 10/12/2016    Obesity 10/12/2016   Rapid palpitations 09/06/2013    Class: Acute   Anxiety    Depression    Past Medical History:  Diagnosis Date   ADHD    Anxiety    Depression    History of chicken pox     Family History  Problem Relation Age of Onset   Dementia Mother    Parkinson's disease Father     Past Surgical History:  Procedure Laterality Date   ANKLE SURGERY     RIGHT   FRACTURE SURGERY     SHOULDER SURGERY     LEFT   TONSILLECTOMY     Social History   Occupational History   Occupation: Psychologist, occupational  Tobacco Use   Smoking status: Never   Smokeless tobacco: Never  Substance and Sexual Activity   Alcohol use: No   Drug use: Yes    Types: Marijuana   Sexual activity: Yes

## 2021-07-14 ENCOUNTER — Other Ambulatory Visit: Payer: Self-pay

## 2021-07-14 ENCOUNTER — Ambulatory Visit: Payer: BC Managed Care – PPO | Admitting: Orthopedic Surgery

## 2021-07-14 ENCOUNTER — Encounter: Payer: Self-pay | Admitting: Orthopedic Surgery

## 2021-07-14 DIAGNOSIS — M75122 Complete rotator cuff tear or rupture of left shoulder, not specified as traumatic: Secondary | ICD-10-CM | POA: Diagnosis not present

## 2021-07-14 DIAGNOSIS — S83241D Other tear of medial meniscus, current injury, right knee, subsequent encounter: Secondary | ICD-10-CM | POA: Diagnosis not present

## 2021-07-14 NOTE — Progress Notes (Signed)
Office Visit Note   Patient: Kyle Butler           Date of Birth: [birth year]           MRN: [id number] Visit Date: 07/14/2021 Requested by: No referring provider defined for this encounter. PCP: Patient, No Pcp Per (Inactive)  Subjective: Chief Complaint  Patient presents with   Right Knee - Follow-up   Left Shoulder - Follow-up    HPI: Kyle Butler is a 45 year old patient with right knee and left shoulder pain.  He has had MRI scans of both of these regions.  The scans are reviewed today.  He works at [employer].  Has not worked in the past several weeks.  He has had left shoulder pain for many years.  Has prior history of [place] type procedure for instability 20 years ago.  Pain has been worse over the past 5 years.  Does not report a discrete injury.  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  Patient also reports right knee pain.  Started several weeks ago when he heard a loud pop in the posterior aspect of his knee.  He is taking hydrocodone for pain.  No prior surgeries.  Patient does not smoke.  MRI scans are reviewed.  MRI of the right knee shows meniscal root tear with minimal displacement but some extrusion of the meniscus is present.  Not too much arthritis present in the medial lateral or patellofemoral compartment.  ACL intact PCL intact.  Shoulder MRI scan is also reviewed.  Shows a small full-thickness tear of the supraspinatus measuring about 8 x 8 mm.  No retraction and no muscle atrophy present.              ROS: All systems reviewed are negative as they relate to the chief complaint within the history of present illness.  Patient denies  fevers or chills.   Assessment & Plan: Visit Diagnoses: No diagnosis found.  Plan: Impression is left shoulder rotator cuff tear which is small and nonretracted.  Patient also has right knee meniscal root tear which is his more pressing problem.  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  I  will think he is good to be able to do that kind of work until he at least gets his right knee repaired.  Meniscal root repair would be indicated at this time.  The risk and benefits of that procedure are discussed including not limited to infection nerve vessel damage potential rerupture or incomplete healing with subsequent development of arthritis in the knee requiring knee replacement sometime in the near future.  Patient will also require Xarelto in the postop period with ultrasound to rule out DVT ordered at the first postop visit.  Patient understands the risk and benefits as well as the nonweightbearing recommendation for the first 4 weeks after surgery.  All questions answered.  Follow-Up Instructions: No follow-ups on file.   Orders:  No orders of the defined types were placed in this encounter.  No orders of the defined types were placed in this encounter.     Procedures: No procedures performed   Clinical Data: No additional findings.  Objective: Vital Signs: There were no vitals taken for this visit.  Physical Exam:   Constitutional: Patient appears well-developed HEENT:  Head: Normocephalic Eyes:EOM are normal Neck: Normal range of motion Cardiovascular: Normal rate Pulmonary/chest: Effort normal Neurologic: Patient is alert Skin: Skin is warm Psychiatric: Patient has normal  mood and affect   Ortho Exam: Ortho exam demonstrates full active and passive range of motion of the right and left shoulder.  Has pretty reasonable strength infraspinatus supraspinatus subscap muscle testing on the left.  Passive range of motion is 35/95/170.  No anterior or posterior apprehension.  Does have a little bit of coarseness with internal and external rotation at 90 degrees and AB duction most palpable at the anterolateral aspect of the acromion.  Examination of the right knee demonstrates no calf tenderness with negative Homans.  Mild effusion is present.  Collateral and cruciate  ligaments are stable.  Extensor mechanism is intact.  Has medial greater than lateral joint line tenderness.  Specialty Comments:  No specialty comments available.  Imaging: No results found.   PMFS History: Patient Active Problem List   Diagnosis Date Noted   Routine general medical examination at a health care facility 10/12/2016   Obesity 10/12/2016   Rapid palpitations 09/06/2013    Class: Acute   Anxiety    Depression    Past Medical History:  Diagnosis Date   ADHD    Anxiety    Depression    History of chicken pox     Family History  Problem Relation Age of Onset   Dementia Mother    Parkinson's disease Father     Past Surgical History:  Procedure Laterality Date   ANKLE SURGERY     RIGHT   FRACTURE SURGERY     SHOULDER SURGERY     LEFT   TONSILLECTOMY     Social History   Occupational History   Occupation: Psychologist, occupational  Tobacco Use   Smoking status: Never   Smokeless tobacco: Never  Substance and Sexual Activity   Alcohol use: No   Drug use: Yes    Types: Marijuana   Sexual activity: Yes

## 2021-07-22 ENCOUNTER — Telehealth: Payer: Self-pay | Admitting: Orthopedic Surgery

## 2021-07-22 NOTE — Telephone Encounter (Signed)
Patient's wife Mischa called asked if patient can get something for pain? Mischa said patient is out of his pain medicine. The number to contact Mischa is [phone]

## 2021-07-23 ENCOUNTER — Other Ambulatory Visit: Payer: Self-pay | Admitting: Surgical

## 2021-07-23 MED ORDER — HYDROCODONE-ACETAMINOPHEN 5-325 MG PO TABS: 1.0000 | ORAL_TABLET | Freq: Two times a day (BID) | ORAL | 0 refills | Status: DC | PRN

## 2021-07-23 NOTE — Telephone Encounter (Signed)
Sent in RX for norco, recommend he take as little of the norco as he can to prevent postop pain from being worse

## 2021-07-23 NOTE — Telephone Encounter (Signed)
Called patient no answer LMOM. Need to advise on message below.  

## 2021-07-26 ENCOUNTER — Ambulatory Visit: Payer: BC Managed Care – PPO | Admitting: Orthopedic Surgery

## 2021-07-26 ENCOUNTER — Telehealth: Payer: Self-pay

## 2021-07-26 NOTE — Telephone Encounter (Signed)
Pts wife called stating that the pharm sent back a request for prior auth. Please advise. Pts wife said you can call her back with any questions  [phone]

## 2021-07-26 NOTE — Telephone Encounter (Signed)
PA was completed. Spoke with representative at [facility]-was approved for the next 6 months. Per [facility] rep pharmacy would just need to enter the PPS code. I tried calling the pharmacy multiple times to advise. Excessive hold times and the last time was disconnected after being on hold for over 15 min. I called patients wife and advised. She will try to reach out the pharmacy. She will let me know if they have any other issues.

## 2021-07-27 NOTE — Telephone Encounter (Signed)
Error

## 2021-08-15 ENCOUNTER — Other Ambulatory Visit: Payer: Self-pay | Admitting: Surgical

## 2021-08-16 ENCOUNTER — Telehealth: Payer: Self-pay | Admitting: Orthopedic Surgery

## 2021-08-16 ENCOUNTER — Encounter: Payer: Self-pay | Admitting: Orthopedic Surgery

## 2021-08-16 DIAGNOSIS — S83241D Other tear of medial meniscus, current injury, right knee, subsequent encounter: Secondary | ICD-10-CM | POA: Diagnosis not present

## 2021-08-16 MED ORDER — METHOCARBAMOL 500 MG PO TABS: 500.0000 mg | ORAL_TABLET | Freq: Three times a day (TID) | ORAL | 0 refills | Status: AC | PRN

## 2021-08-16 MED ORDER — OXYCODONE-ACETAMINOPHEN 5-325 MG PO TABS: 1.0000 | ORAL_TABLET | Freq: Four times a day (QID) | ORAL | 0 refills | Status: AC | PRN

## 2021-08-16 MED ORDER — RIVAROXABAN 10 MG PO TABS: 10.0000 mg | ORAL_TABLET | Freq: Every day | ORAL | 0 refills | Status: AC

## 2021-08-16 NOTE — Telephone Encounter (Signed)
Patient's wife called. Says someone needs to call his insurance in order for him to get pain medication from the pharmacy. He just had surgery today. Wife does not want him going home without pain meds. Her call back number is [phone]

## 2021-08-17 NOTE — Telephone Encounter (Signed)
[facility] and verified all 3 medications filled at pharmacy.

## 2021-08-18 ENCOUNTER — Other Ambulatory Visit: Payer: Self-pay

## 2021-08-18 DIAGNOSIS — S83241D Other tear of medial meniscus, current injury, right knee, subsequent encounter: Secondary | ICD-10-CM

## 2021-08-18 NOTE — Progress Notes (Signed)
Dr August Saucer would like for patient to have an u/s rule out DVT on Monday the 10th before his initial post op appt on the 10th at 115pm Patient has hx of DVT

## 2021-08-18 NOTE — Progress Notes (Signed)
Martie Lee to take care of this

## 2021-08-19 NOTE — Progress Notes (Signed)
Pt is scheduled Oct 7 at 1 at [facility]. Message was left on pt vm withh appt date/time

## 2021-08-20 ENCOUNTER — Ambulatory Visit (HOSPITAL_COMMUNITY)
Admission: RE | Admit: 2021-08-20 | Discharge: 2021-08-20 | Disposition: A | Discharge status: Home / Self Care | Payer: BC Managed Care – PPO | Source: Ambulatory Visit | Attending: Orthopedic Surgery | Admitting: Orthopedic Surgery

## 2021-08-20 ENCOUNTER — Other Ambulatory Visit: Payer: Self-pay

## 2021-08-20 DIAGNOSIS — S83241D Other tear of medial meniscus, current injury, right knee, subsequent encounter: Secondary | ICD-10-CM | POA: Diagnosis not present

## 2021-08-20 NOTE — Progress Notes (Signed)
Pt aware of appt.

## 2021-08-23 ENCOUNTER — Ambulatory Visit (INDEPENDENT_AMBULATORY_CARE_PROVIDER_SITE_OTHER): Payer: BC Managed Care – PPO | Admitting: Orthopedic Surgery

## 2021-08-23 ENCOUNTER — Other Ambulatory Visit: Payer: Self-pay

## 2021-08-23 DIAGNOSIS — S83241D Other tear of medial meniscus, current injury, right knee, subsequent encounter: Secondary | ICD-10-CM

## 2021-08-27 ENCOUNTER — Encounter: Payer: Self-pay | Admitting: Orthopedic Surgery

## 2021-08-27 NOTE — Progress Notes (Signed)
   Post-Op Visit Note   Patient: Kyle Butler           Date of Birth: February 07, 1976           MRN: [id number] Visit Date: 08/23/2021 PCP: Patient, No Pcp Per (Inactive)   Assessment & Plan:  Chief Complaint:  Chief Complaint  Patient presents with   Right Knee - Routine Post Op    08/16/21 Right knee scope deb vs meniscal root repair   Visit Diagnoses:  1. Acute medial meniscus tear of right knee, subsequent encounter     Plan: Tyra is a 45 year old patient who underwent meniscal root repair 08/16/2021.  Describes having a pop when he was getting around last week.  Also having some right shoulder issues.  Ultrasound was negative for DVT as he has a history of deep vein thrombosis in the right leg.  I would like him to continue Xarelto until he is out and then change over to aspirin 81 mg a day.  Okay to bend less than 90 degrees at this time.  Negative Homans no calf tenderness today.  Trace effusion present.  Hard to say about the pop that he had but for now I would favor 3-week return with initiation of weightbearing at that time.  Okay for straight leg raises.  Follow-Up Instructions: Return in about 3 weeks (around 09/13/2021).   Orders:  No orders of the defined types were placed in this encounter.  No orders of the defined types were placed in this encounter.   Imaging: No results found.  PMFS History: Patient Active Problem List   Diagnosis Date Noted   Routine general medical examination at a health care facility 10/12/2016   Obesity 10/12/2016   Rapid palpitations 09/06/2013    Class: Acute   Anxiety    Depression    Past Medical History:  Diagnosis Date   ADHD    Anxiety    Depression    History of chicken pox     Family History  Problem Relation Age of Onset   Dementia Mother    Parkinson's disease Father     Past Surgical History:  Procedure Laterality Date   ANKLE SURGERY     RIGHT   FRACTURE SURGERY     SHOULDER SURGERY     LEFT    TONSILLECTOMY     Social History   Occupational History   Occupation: Psychologist, occupational  Tobacco Use   Smoking status: Never   Smokeless tobacco: Never  Substance and Sexual Activity   Alcohol use: No   Drug use: Yes    Types: Marijuana   Sexual activity: Yes

## 2021-09-13 ENCOUNTER — Other Ambulatory Visit: Payer: Self-pay

## 2021-09-13 ENCOUNTER — Encounter: Payer: Self-pay | Admitting: Orthopedic Surgery

## 2021-09-13 ENCOUNTER — Ambulatory Visit (INDEPENDENT_AMBULATORY_CARE_PROVIDER_SITE_OTHER): Payer: BC Managed Care – PPO | Admitting: Surgical

## 2021-09-13 DIAGNOSIS — S83241D Other tear of medial meniscus, current injury, right knee, subsequent encounter: Secondary | ICD-10-CM

## 2021-09-13 NOTE — Progress Notes (Signed)
Post-Op Visit Note   Patient: Kyle Butler           Date of Birth: 05-31-1976           MRN: [id number] Visit Date: 09/13/2021 PCP: Patient, No Pcp Per (Inactive)   Assessment & Plan:  Chief Complaint:  Chief Complaint  Patient presents with   Right Knee - Routine Post Op     08/16/21 Right knee scope deb vs meniscal root repair     Visit Diagnoses:  1. Acute medial meniscus tear of right knee, subsequent encounter     Plan: Patient is a 45 year old male who presents s/p right knee meniscal root repair on 08/16/2021.  He is 4 weeks out today.  He reports that he has been partial weightbearing with crutches over the last approximately week.  This is because he has been unable to really use crutches due to the severe pain in his left shoulder from his left rotator cuff tear that was previously reviewed on MRI scan.  He states the right knee is doing well and not really causing him any pain even when he is walking.  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  Denies any chest pain or shortness of breath.  No calf pain.  On examination he has 0 degrees extension and 95 degrees of knee flexion.  Small effusion is present today.  Incisions are well-healed over the portal incisions.  The tibial incision is healing well without any evidence of infection.  There is a small amount of gapping at the proximal aspect of the incision with no expressible drainage.  A small amount of suture material was removed from the incision today.  Incision did not probe to bone.  No calf tenderness.  Negative Homans' sign.  Excellent quad strength rated 5/5.  Able to perform straight leg raise without extensor lag.  He walks without antalgia.  Plan to begin weightbearing.  He has excellent quadricep strength so  not really much concerned about the knee giving out on him but he will start using a cane in order to give support and then wean off the cane over the next week.  Specifically cautioned patient against a sending stairs with the operative leg leading up to stairs.  No running, jumping, athletic activities, loaded knee flexion.  Really just stick to walking.  Patient understands and agrees with plan.  Follow-up in 4 weeks for clinical recheck.  Cautioned him about the red flag signs and symptoms of infection and what he should look out for regarding his tibial incision.  He will call the office or send a picture of the incision if he has no improvement over the next week to 2 weeks or develops any other concerning symptoms.  Follow-Up Instructions: No follow-ups on file.   Orders:  No orders of the defined types were placed in this encounter.  No orders of the defined types were placed in this encounter.   Imaging: No results found.  PMFS History: Patient Active Problem List   Diagnosis Date Noted   Routine general medical examination at a health care facility 10/12/2016   Obesity 10/12/2016   Rapid palpitations 09/06/2013    Class: Acute   Anxiety    Depression  Past Medical History:  Diagnosis Date   ADHD    Anxiety    Depression    History of chicken pox     Family History  Problem Relation Age of Onset   Dementia Mother    Parkinson's disease Father     Past Surgical History:  Procedure Laterality Date   ANKLE SURGERY     RIGHT   FRACTURE SURGERY     SHOULDER SURGERY     LEFT   TONSILLECTOMY     Social History   Occupational History   Occupation: Psychologist, occupational  Tobacco Use   Smoking status: Never   Smokeless tobacco: Never  Substance and Sexual Activity   Alcohol use: No   Drug use: Yes    Types: Marijuana   Sexual activity: Yes

## 2021-09-29 ENCOUNTER — Telehealth: Payer: Self-pay | Admitting: Orthopedic Surgery

## 2021-09-29 NOTE — Telephone Encounter (Signed)
Pts wife Mischa called stating at the pts 09/13/21 appt Dr.Dean was looking at one of the stitches and said if it wasn't healed in 2 weeks to CB. Mischa states it doesn't look like it's healing and they would like a CB for further instructions.

## 2021-09-29 NOTE — Telephone Encounter (Signed)
Responded to mychart message

## 2021-10-11 ENCOUNTER — Ambulatory Visit (INDEPENDENT_AMBULATORY_CARE_PROVIDER_SITE_OTHER): Payer: BC Managed Care – PPO | Admitting: Orthopedic Surgery

## 2021-10-11 ENCOUNTER — Other Ambulatory Visit: Payer: Self-pay

## 2021-10-11 DIAGNOSIS — S83241D Other tear of medial meniscus, current injury, right knee, subsequent encounter: Secondary | ICD-10-CM

## 2021-10-12 ENCOUNTER — Encounter: Payer: Self-pay | Admitting: Orthopedic Surgery

## 2021-10-12 NOTE — Progress Notes (Signed)
   Post-Op Visit Note   Patient: GAETANO ROMBERGER           Date of Birth: [birth year]           MRN: [id number] Visit Date: 10/11/2021 PCP: Patient, No Pcp Per (Inactive)   Assessment & Plan:  Chief Complaint:  Chief Complaint  Patient presents with   Right Knee - Routine Post Op   Visit Diagnoses:  1. Acute medial meniscus tear of right knee, subsequent encounter     Plan: Maejor is a 44 year old patient who is now about 6 weeks out right knee meniscal root repair.  He is weightbearing without much difficulty.  On exam he is got a mild effusion but good range of motion.  Plan at this time is to return in 6 weeks for recheck to see how much effusion he has.  Today it is around 25 cc.  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  He will need to be out of work at least 6 more weeks for his [employer] job.  Follow-up in 6 weeks for clinical recheck.  Follow-Up Instructions: No follow-ups on file.   Orders:  No orders of the defined types were placed in this encounter.  No orders of the defined types were placed in this encounter.   Imaging: No results found.  PMFS History: Patient Active Problem List   Diagnosis Date Noted   Routine general medical examination at a health care facility 10/12/2016   Obesity 10/12/2016   Rapid palpitations 09/06/2013    Class: Acute   Anxiety    Depression    Past Medical History:  Diagnosis Date   ADHD    Anxiety    Depression    History of chicken pox     Family History  Problem Relation Age of Onset   Dementia Mother    Parkinson's disease Father     Past Surgical History:  Procedure Laterality Date   ANKLE SURGERY     RIGHT   FRACTURE SURGERY     SHOULDER SURGERY     LEFT   TONSILLECTOMY     Social History   Occupational History   Occupation: Psychologist, occupational  Tobacco Use   Smoking status: Never   Smokeless tobacco: Never  Substance and Sexual Activity   Alcohol use: No   Drug use:  Yes    Types: Marijuana   Sexual activity: Yes

## 2021-10-13 ENCOUNTER — Telehealth: Payer: Self-pay | Admitting: Orthopedic Surgery

## 2021-10-13 NOTE — Telephone Encounter (Signed)
Attempted to contact patient is regards to medical advise that he was requesting however I had to leave a voicemail informing patient to contact our office when he is able to do so.

## 2021-10-13 NOTE — Telephone Encounter (Signed)
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 Letter has been created to keep patient out for recommended 6-8 weeks. No further questions or concerns.

## 2021-10-13 NOTE — Telephone Encounter (Signed)
Patient returned a call to [facility]. Would like a call back.

## 2021-10-13 NOTE — Telephone Encounter (Signed)
This has been handled in a different encounter.

## 2021-10-13 NOTE — Telephone Encounter (Signed)
Pt called requesting a call back. Pt states he need medical advice. Pt phone number is [phone].

## 2021-11-22 ENCOUNTER — Ambulatory Visit (INDEPENDENT_AMBULATORY_CARE_PROVIDER_SITE_OTHER): Payer: BC Managed Care – PPO | Admitting: Surgical

## 2021-11-22 ENCOUNTER — Encounter: Payer: Self-pay | Admitting: Surgical

## 2021-11-22 ENCOUNTER — Other Ambulatory Visit: Payer: Self-pay

## 2021-11-22 DIAGNOSIS — M25561 Pain in right knee: Secondary | ICD-10-CM

## 2021-11-22 DIAGNOSIS — S83241D Other tear of medial meniscus, current injury, right knee, subsequent encounter: Secondary | ICD-10-CM

## 2021-11-22 NOTE — Progress Notes (Signed)
Post-Op Visit Note   Patient: Kyle Butler           Date of Birth: [birth year]           MRN: [id number] Visit Date: 11/22/2021 PCP: Patient, No Pcp Per (Inactive)   Assessment & Plan:  Chief Complaint: No chief complaint on file.  Visit Diagnoses:  1. Acute pain of right knee   2. Acute medial meniscus tear of right knee, subsequent encounter     Plan: Patient is a 46 year old male who presents s/p right knee meniscal repair on 08/16/2021.  He is a little over 3 months out at this point.  He reports that he is doing okay.  He states he is about 85% of his baseline.  He is able to walk around with a very slight limp according to his wife.  The pain and soreness he experiences is mostly depending on his activity level.  He denies any mechanical symptoms of the knee or any giving out of the knee.  On exam he has 0 degrees extension and 95 degrees of knee flexion.  No calf tenderness.  Negative Homans' sign.  Minimal joint line tenderness over the medial or lateral joint lines.  Moderate effusion noted with about 20 cc of fluid by evaluation.  Quad strength rated 5 -/5.  Able to perform straight leg raise.  Incisions are healed well.  Discussed plan with patient.  He wants to eventually return to working at [employer] which involves lifting 50 to 60 pound boxes.  He has what feels like a reduced diffusion compared with his last exam by Dr. August Saucer.  He is lacking some knee flexion compared with the contralateral knee and his quad is not as strong as his contralateral leg.  With all this, plan to hold off and return to the [employer] for another 6 weeks.  Referred him to physical therapy upstairs for 1 session to design a home exercise program to teach him some knee flexion exercises and quadricep strengthening exercises.  Follow-up in 6 weeks for clinical recheck with Dr. August Saucer and likely release to regular duty at that time.  Cautioned him against any loaded knee flexion specifically today.  Follow-Up  Instructions: No follow-ups on file.   Orders:  No orders of the defined types were placed in this encounter.  No orders of the defined types were placed in this encounter.   Imaging: No results found.  PMFS History: Patient Active Problem List   Diagnosis Date Noted   Routine general medical examination at a health care facility 10/12/2016   Obesity 10/12/2016   Rapid palpitations 09/06/2013    Class: Acute   Anxiety    Depression    Past Medical History:  Diagnosis Date   ADHD    Anxiety    Depression    History of chicken pox     Family History  Problem Relation Age of Onset   Dementia Mother    Parkinson's disease Father     Past Surgical History:  Procedure Laterality Date   ANKLE SURGERY     RIGHT   FRACTURE SURGERY     SHOULDER SURGERY     LEFT   TONSILLECTOMY     Social History   Occupational History   Occupation: Psychologist, occupational  Tobacco Use   Smoking status: Never   Smokeless tobacco: Never  Substance and Sexual Activity   Alcohol use: No   Drug use: Yes    Types: Marijuana   Sexual activity:  Yes

## 2021-11-25 ENCOUNTER — Other Ambulatory Visit: Payer: Self-pay

## 2021-11-25 ENCOUNTER — Encounter: Payer: Self-pay | Admitting: Physical Therapy

## 2021-11-25 ENCOUNTER — Encounter: Payer: Self-pay | Admitting: Orthopedic Surgery

## 2021-11-25 ENCOUNTER — Ambulatory Visit (INDEPENDENT_AMBULATORY_CARE_PROVIDER_SITE_OTHER): Payer: BC Managed Care – PPO | Admitting: Physical Therapy

## 2021-11-25 DIAGNOSIS — M6281 Muscle weakness (generalized): Secondary | ICD-10-CM

## 2021-11-25 DIAGNOSIS — M25561 Pain in right knee: Secondary | ICD-10-CM | POA: Diagnosis not present

## 2021-11-25 DIAGNOSIS — M25661 Stiffness of right knee, not elsewhere classified: Secondary | ICD-10-CM

## 2021-11-25 NOTE — Therapy (Addendum)
[facility] [address] [place], [place], [address] Phone: [phone]   Fax:  [phone]  Physical Therapy Evaluation/Discharge PHYSICAL THERAPY DISCHARGE SUMMARY  Visits from Start of Care: 1  Current functional level related to goals / functional outcomes: See below   Remaining deficits: See below   Education / Equipment: HEP  Plan: Patient is being discharged due to not returning to PT since eval.  [name], PT, DPT 04/19/22 1:46 PM     Patient Details  Name: Kyle Butler MRN: [id number] Date of Birth: 12-17-1975 Referring Provider (PT): Magnant   Encounter Date: 11/25/2021   PT End of Session - 11/25/21 1633     Visit Number 1    Number of Visits 8    Date for PT Re-Evaluation 01/06/22    PT Start Time 1145    PT Stop Time 1230    PT Time Calculation (min) 45 min    Activity Tolerance Patient tolerated treatment well    Behavior During Therapy WFL for tasks assessed/performed             Past Medical History:  Diagnosis Date   ADHD    Anxiety    Depression    History of chicken pox     Past Surgical History:  Procedure Laterality Date   ANKLE SURGERY     RIGHT   FRACTURE SURGERY     SHOULDER SURGERY     LEFT   TONSILLECTOMY      There were no vitals filed for this visit.    Subjective Assessment - 11/25/21 1149     Subjective He has chief complaint of Rt anterior knee pain and pain on the sides. he had Rt knee scope with meniscal repain on 08/16/21. He has not had any PT or done any exercises post op as he states no one told him what to do. He is now referred to PT by PA-C.  He wants to eventually return to working at [employer] which involves lifting 50 to 60 pound boxes.    How long can you stand comfortably? 2 hours    Patient Stated Goals return to work at [employer] and get down on his knee to work on cars    Currently in Pain? Yes    Pain Score 2     Pain Location Knee    Pain Orientation Right     Pain Descriptors / Indicators Aching    Pain Type Acute pain    Aggravating Factors  standing more than 2 hours, stairs, squattin    Pain Relieving Factors ice                [facility] PT Assessment - 11/25/21 0001       Assessment   Medical Diagnosis S/P Rt knee meniscal repain on 08/16/21.    Referring Provider (PT) Magnant    Next MD Visit 12/15/21    Prior Therapy none      Precautions   Precautions None      Restrictions   Weight Bearing Restrictions No      Balance Screen   Has the patient fallen in the past 6 months No    Has the patient had a decrease in activity level because of a fear of falling?  No    Is the patient reluctant to leave their home because of a fear of falling?  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    Leisure [employer]      Cognition   Overall Cognitive Status Within Functional Limits for tasks assessed      Observation/Other Assessments   Focus on Therapeutic Outcomes (FOTO)  63% limited, goal is 73%      ROM / Strength   AROM / PROM / Strength AROM;Strength      AROM   AROM Assessment Site Knee    Right/Left Knee Right    Right Knee Extension 0    Right Knee Flexion 95      Strength   Overall Strength Comments 4+ to 5- Rt knee strength out of 5      Special Tests   Other special tests stable ligamentous testing, normal patella tracking, very tight quads on Rt      Ambulation/Gait   Gait Comments slight limp with ambulation and functional weakness noted with stair navigation                        Objective measurements completed on examination: See above findings.       [facility] Adult PT Treatment/Exercise - 11/25/21 0001       Exercises    Exercises Knee/Hip      Knee/Hip Exercises: Stretches   Other Knee/Hip Stretches supine heel slides AAROM 10 sec X5    Other Knee/Hip Stretches seated tailgate knee flexion stretch 10 sec x10, standing lunge stretch in chair 5 sec x10      Knee/Hip Exercises: Aerobic   Nustep L8 X6 min      Knee/Hip Exercises: Seated   Other Seated Knee/Hip Exercises seated SLR on Rt X10    Sit to Sand 10 reps   Rt leg further in front for increased weight shift to Rt, needs UE support to stand and then can sit without UE support focusing on slow eccentrics                    PT Education - 11/25/21 1633     Education Details HEP,POC    Person(s) Educated Patient    Methods Explanation;Demonstration;Verbal cues;Handout    Comprehension Verbalized understanding;Returned demonstration;Need further instruction                 PT Long Term Goals - 11/25/21 1639       PT LONG TERM GOAL #1   Title Pt will improve FOTO to 73% functional    Time 6    Period Weeks    Status New    Target Date 01/06/22      PT LONG TERM GOAL #2   Title He will improve Rt knee flexion ROM to at least 125 deg for crawling and squatting    Time 6    Period Weeks    Status New      PT LONG TERM GOAL #3   Title Pt will show 5/5 MMT and good functional strength with stairs, squats, and lifting    Time 6    Period Weeks    Status New                    Plan - 11/25/21 1634  Clinical Impression Statement Pt presents with Rt knee pain, stiffness, and functional weakness S/P right knee S/P Rt knee meniscal repain on 08/16/21. MD recomended one visit on PT referral for HEP but after discussion with patient he feels PT is necessary to help him build back the strength, ROM, and endurance needed to return to his normal physical job at [employer] lifting 50-70 lb boxes constantly. I provided him with HEP and we will plan to see him 1-2 times per week for 4-6 weeks    Personal Factors and Comorbidities  Comorbidity 1    Comorbidities PMH:anx,dep,ADHD,Rt ankle and knee surgery    Examination-Activity Limitations Bend;Carry;Squat;Stairs;Stand;Lift;Locomotion Level    Examination-Participation Restrictions Community Activity;Yard Work;Shop;Occupation    Stability/Clinical Decision Making Stable/Uncomplicated    Clinical Decision Making Low    Rehab Potential Good    PT Frequency 1x / week   1-2   PT Duration 6 weeks   4-6   PT Treatment/Interventions Cryotherapy;Electrical Stimulation;Iontophoresis 4mg /ml Dexamethasone;Moist Heat;Ultrasound;Therapeutic activities;Therapeutic exercise;Neuromuscular re-education;Manual techniques;Passive range of motion;Dry needling;Joint Manipulations;Vasopneumatic Device;Taping    PT Next Visit Plan quad stretch and knee flexion emphasis, leg press and gym equipment as able and add this into HEP    PT Home Exercise Plan Access Code: [email or web address]    Consulted and Agree with Plan of Care Patient             Patient will benefit from skilled therapeutic intervention in order to improve the following deficits and impairments:  Decreased activity tolerance, Decreased endurance, Decreased mobility, Decreased strength, Difficulty walking, Impaired flexibility, Pain, Decreased range of motion  Visit Diagnosis: Acute pain of right knee  Muscle weakness (generalized)  Stiffness of right knee, not elsewhere classified     Problem List Patient Active Problem List   Diagnosis Date Noted   Routine general medical examination at a health care facility 10/12/2016   Obesity 10/12/2016   Rapid palpitations 09/06/2013    Class: Acute   Anxiety    Depression     09/08/2013, PT,DPT 11/25/2021, 4:41 PM  [facility] [address] [place], [place], [place] Phone: [phone]   Fax:  [phone]  Name: MOISHE SCHELLENBERG MRN: Dawayne Patricia Date of Birth: 05-18-1976

## 2021-11-25 NOTE — Patient Instructions (Signed)
Access Code: [email or web address] URL: [email or web address] Date: 11/25/2021 Prepared by: [name]  Exercises Supine Heel Slide with Strap - 2 x daily - 6 x weekly - 1-2 sets - 10 reps - 10 sec hold Figure 4 Bridge - 2 x daily - 6 x weekly - 1-2 sets - 15 reps - 5 sec hold Seated Knee Flexion Stretch - 2 x daily - 6 x weekly - 1-2 sets - 10 reps - 10 sec hold Sit to Stand with Armchair - 2 x daily - 6 x weekly - 1-3 sets - 5 reps Seated Straight Leg Heel Taps - 2 x daily - 6 x weekly - 3 sets - 10 reps standing knee flexion stretch on chair or step - 2 x daily - 6 x weekly - 1 sets - 10 reps - 5 sec hold

## 2021-11-29 ENCOUNTER — Encounter: Payer: BC Managed Care – PPO | Admitting: Physical Therapy

## 2021-12-07 ENCOUNTER — Encounter: Payer: BC Managed Care – PPO | Admitting: Physical Therapy

## 2021-12-15 ENCOUNTER — Other Ambulatory Visit: Payer: Self-pay

## 2021-12-15 ENCOUNTER — Encounter: Payer: Self-pay | Admitting: Orthopedic Surgery

## 2021-12-15 ENCOUNTER — Ambulatory Visit: Payer: BC Managed Care – PPO | Admitting: Orthopedic Surgery

## 2021-12-15 ENCOUNTER — Ambulatory Visit (INDEPENDENT_AMBULATORY_CARE_PROVIDER_SITE_OTHER): Payer: BC Managed Care – PPO

## 2021-12-15 DIAGNOSIS — S83241D Other tear of medial meniscus, current injury, right knee, subsequent encounter: Secondary | ICD-10-CM

## 2021-12-16 ENCOUNTER — Encounter: Payer: Self-pay | Admitting: Orthopedic Surgery

## 2021-12-16 NOTE — Progress Notes (Signed)
° °  Post-Op Visit Note   Patient: Kyle Butler           Date of Birth: [birth year]           MRN: [id number] Visit Date: 12/15/2021 PCP: Patient, No Pcp Per (Inactive)   Assessment & Plan:  Chief Complaint:  Chief Complaint  Patient presents with   Right Knee - Follow-up   Visit Diagnoses:  1. Acute medial meniscus tear of right knee, subsequent encounter     Plan: Kyle Butler is a 46 year old patient is now about 4 months out right knee arthroscopy with meniscal root repair.  He is feeling a popping in the knee.  Doing physical therapy primarily stationary bike.  Works at [employer] part-time but has not started back yet.  Physical therapy has been completed.  Denies any recent injuries or falls.  He did have some event relatively quickly after his surgery but seems to have recovered from that.  On examination he has trace effusion with excellent range of motion.  He does have some popping that is more consistent with scar tissue over the medial epicondyle as opposed to anything in the joint line medially.  No medial anterior or posterior joint line tenderness.  Quad strength is improving.  Plan at this time is to return back to [employer] part-time.  He has been trying some lifting and standing at home and has done well with that.  Come back in 3 months for final check.  Follow-Up Instructions: No follow-ups on file.   Orders:  Orders Placed This Encounter  Procedures   XR KNEE 3 VIEW RIGHT   No orders of the defined types were placed in this encounter.   Imaging: No results found.  PMFS History: Patient Active Problem List   Diagnosis Date Noted   Routine general medical examination at a health care facility 10/12/2016   Obesity 10/12/2016   Rapid palpitations 09/06/2013    Class: Acute   Anxiety    Depression    Past Medical History:  Diagnosis Date   ADHD    Anxiety    Depression    History of chicken pox     Family History  Problem Relation Age of Onset   Dementia Mother     Parkinson's disease Father     Past Surgical History:  Procedure Laterality Date   ANKLE SURGERY     RIGHT   FRACTURE SURGERY     SHOULDER SURGERY     LEFT   TONSILLECTOMY     Social History   Occupational History   Occupation: Psychologist, occupational  Tobacco Use   Smoking status: Never   Smokeless tobacco: Never  Substance and Sexual Activity   Alcohol use: No   Drug use: Yes    Types: Marijuana   Sexual activity: Yes

## 2022-03-14 ENCOUNTER — Ambulatory Visit: Payer: BC Managed Care – PPO | Admitting: Orthopedic Surgery

## 2022-06-29 DIAGNOSIS — F410 Panic disorder [episodic paroxysmal anxiety] without agoraphobia: Secondary | ICD-10-CM | POA: Diagnosis not present

## 2022-06-29 DIAGNOSIS — F909 Attention-deficit hyperactivity disorder, unspecified type: Secondary | ICD-10-CM | POA: Diagnosis not present

## 2022-10-27 DIAGNOSIS — F909 Attention-deficit hyperactivity disorder, unspecified type: Secondary | ICD-10-CM | POA: Diagnosis not present

## 2022-10-27 DIAGNOSIS — F410 Panic disorder [episodic paroxysmal anxiety] without agoraphobia: Secondary | ICD-10-CM | POA: Diagnosis not present

## 2023-01-19 DIAGNOSIS — D485 Neoplasm of uncertain behavior of skin: Secondary | ICD-10-CM | POA: Diagnosis not present

## 2023-02-20 ENCOUNTER — Ambulatory Visit: Payer: Self-pay

## 2023-02-20 ENCOUNTER — Other Ambulatory Visit: Payer: Self-pay | Admitting: Family Medicine

## 2023-02-20 DIAGNOSIS — M25562 Pain in left knee: Secondary | ICD-10-CM

## 2023-02-22 ENCOUNTER — Ambulatory Visit: Payer: BC Managed Care – PPO | Admitting: Orthopedic Surgery

## 2023-06-06 DIAGNOSIS — F419 Anxiety disorder, unspecified: Secondary | ICD-10-CM | POA: Diagnosis not present

## 2023-06-06 DIAGNOSIS — F4322 Adjustment disorder with anxiety: Secondary | ICD-10-CM | POA: Diagnosis not present

## 2023-06-06 DIAGNOSIS — G4700 Insomnia, unspecified: Secondary | ICD-10-CM | POA: Diagnosis not present

## 2023-06-24 DIAGNOSIS — H04123 Dry eye syndrome of bilateral lacrimal glands: Secondary | ICD-10-CM | POA: Diagnosis not present

## 2023-06-24 DIAGNOSIS — H40033 Anatomical narrow angle, bilateral: Secondary | ICD-10-CM | POA: Diagnosis not present

## 2023-06-26 ENCOUNTER — Other Ambulatory Visit (INDEPENDENT_AMBULATORY_CARE_PROVIDER_SITE_OTHER): Payer: BC Managed Care – PPO

## 2023-06-26 ENCOUNTER — Ambulatory Visit (INDEPENDENT_AMBULATORY_CARE_PROVIDER_SITE_OTHER): Payer: BC Managed Care – PPO | Admitting: Orthopedic Surgery

## 2023-06-26 ENCOUNTER — Encounter: Payer: Self-pay | Admitting: Orthopedic Surgery

## 2023-06-26 DIAGNOSIS — G8929 Other chronic pain: Secondary | ICD-10-CM

## 2023-06-26 DIAGNOSIS — M542 Cervicalgia: Secondary | ICD-10-CM

## 2023-06-26 DIAGNOSIS — M75122 Complete rotator cuff tear or rupture of left shoulder, not specified as traumatic: Secondary | ICD-10-CM

## 2023-06-26 DIAGNOSIS — M25561 Pain in right knee: Secondary | ICD-10-CM

## 2023-06-26 DIAGNOSIS — M25512 Pain in left shoulder: Secondary | ICD-10-CM | POA: Diagnosis not present

## 2023-06-26 NOTE — Progress Notes (Signed)
Office Visit Note   Patient: Kyle Butler           Date of Birth: 05-08-1976           MRN: [id number] Visit Date: 06/26/2023 Requested by: No referring provider defined for this encounter. PCP: Patient, No Pcp Per  Subjective: Chief Complaint  Patient presents with   Right Knee - Pain   Left Shoulder - Pain    HPI: Kyle Butler is a 47 y.o. male who presents to the office reporting multiple orthopedic complaints today.  He describes having a left knee injury at work with surgery on 624 for what sounds like meniscal pathology.  This is being treated elsewhere.  Also is having mild recurrent pain in the right knee on the medial side.  Has a history several years ago of meniscal root repair.  Has not had a discrete injury but he does feel some mild pain going up and down stairs.  He does use a strap around the knee on the right-hand side which helps.  He also describes left shoulder pain.  He describes 2 years of symptoms.  States that his whole arm and goes numb in his scapula is also involved in the pain problem.  Has a history of left shoulder surgery 22 years ago for instability.  Pain is radiate down the arm into the hand.  Patient also describes some neck pain but no right sided radicular symptoms..                ROS: All systems reviewed are negative as they relate to the chief complaint within the history of present illness.  Patient denies fevers or chills.  Assessment & Plan: Visit Diagnoses:  1. Complete tear of left rotator cuff, unspecified whether traumatic   2. Chronic pain of right knee   3. Chronic left shoulder pain   4. Neck pain     Plan: Impression is right knee pain with mild narrowing progressively over the past year of the medial joint space but no effusion today.  I think this is something we can watch.  No indication for further intervention at this time.  Regarding the shoulder and neck it looks like he has pretty reasonable range of motion and  rotator cuff strength on the left but with some coarse grinding at 90 degrees of abduction.  Rotator cuff strength is intact.  Most likely based on history of surgery for instability he may have some nonradiographic arthritis present.  Has not had any instability in the shoulder since the surgery.  MRI arthrogram of that left shoulder indicated to evaluate for occult arthritis versus rotator cuff tear with history of instability surgery more than 20 years ago.  I think it is also possible that he has left-sided radiculopathy based on numbness and tingling affecting his whole arm, pain affecting his whole arm, scapular symptoms as well.  MRI scan of the cervical spine indicated.  Plain radiographs of both the shoulder and cervical spine are unremarkable for significant bony pathology at this time.  Follow-Up Instructions: No follow-ups on file.   Orders:  Orders Placed This Encounter  Procedures   XR Knee 1-2 Views Right   XR Shoulder Left   XR Cervical Spine 2 or 3 views   MR Cervical Spine w/o contrast   MR Shoulder Left w/ contrast   Arthrogram   No orders of the defined types were placed in this encounter.     Procedures: No procedures  performed   Clinical Data: No additional findings.  Objective: Vital Signs: There were no vitals taken for this visit.  Physical Exam:  Constitutional: Patient appears well-developed HEENT:  Head: Normocephalic Eyes:EOM are normal Neck: Normal range of motion Cardiovascular: Normal rate Pulmonary/chest: Effort normal Neurologic: Patient is alert Skin: Skin is warm Psychiatric: Patient has normal mood and affect  Ortho Exam: Ortho exam demonstrates excellent range of motion on the right knee with mild medial joint line tenderness but no positive McMurray compression testing.  Collateral cruciate ligaments are stable.  No effusion in the right knee.  No groin pain on the right with internal/external rotation of the leg.  Patient has pretty  reasonable cervical spine range of motion with mild pain with rotation to the left.  Mild paresthesias in the C6 distribution left versus right.  5 out of 5 grip EPL FPL interosseous are/extension biceps triceps and deltoid strength.  Negative shoulder apprehension.  Rotator cuff strength is excellent infraspinatus supraspinatus and subscap muscle testing.  No other masses lymphadenopathy or skin changes noted in that shoulder girdle region.  Well-healed surgical incision is present.  Specialty Comments:  No specialty comments available.  Imaging: XR Knee 1-2 Views Right  Result Date: 06/26/2023 AP lateral radiographs right knee reviewed.  Mild medial joint space narrowing is present.  Alignment intact.  No acute fracture.  At most 1 mm difference in medial joint space narrowing compared to radiographs from 1 year ago.  XR Shoulder Left  Result Date: 06/26/2023 AP axillary outlet radiographs left shoulder reviewed.  Acromiohumeral distance maintained no glenohumeral joint or AC joint arthritis.  No fracture or dislocation.  Visualized lung fields clear.  XR Cervical Spine 2 or 3 views  Result Date: 06/26/2023 AP lateral cervical spine radiographs reviewed.  Normal lordosis is present.  No significant degenerative changes present t.no compression fractures or spondylolisthesis.    PMFS History: Patient Active Problem List   Diagnosis Date Noted   Routine general medical examination at a health care facility 10/12/2016   Obesity 10/12/2016   Rapid palpitations 09/06/2013    Class: Acute   Anxiety    Depression    Past Medical History:  Diagnosis Date   ADHD    Anxiety    Depression    History of chicken pox     Family History  Problem Relation Age of Onset   Dementia Mother    Parkinson's disease Father     Past Surgical History:  Procedure Laterality Date   ANKLE SURGERY     RIGHT   FRACTURE SURGERY     SHOULDER SURGERY     LEFT   TONSILLECTOMY     Social History    Occupational History   Occupation: Psychologist, occupational  Tobacco Use   Smoking status: Never   Smokeless tobacco: Never  Substance and Sexual Activity   Alcohol use: No   Drug use: Yes    Types: Marijuana   Sexual activity: Yes

## 2023-07-04 DIAGNOSIS — F419 Anxiety disorder, unspecified: Secondary | ICD-10-CM | POA: Diagnosis not present

## 2023-07-04 DIAGNOSIS — G4700 Insomnia, unspecified: Secondary | ICD-10-CM | POA: Diagnosis not present

## 2023-07-04 DIAGNOSIS — F4322 Adjustment disorder with anxiety: Secondary | ICD-10-CM | POA: Diagnosis not present

## 2023-07-06 ENCOUNTER — Telehealth: Payer: Self-pay | Admitting: Orthopedic Surgery

## 2023-07-06 NOTE — Telephone Encounter (Signed)
Patient wife called and had questions that she didn't get answered.She stated that she was told he has to get an injection. They are confused. CB#[phone]

## 2023-07-07 DIAGNOSIS — F419 Anxiety disorder, unspecified: Secondary | ICD-10-CM | POA: Diagnosis not present

## 2023-07-07 NOTE — Telephone Encounter (Signed)
I called but the mailbox is full and is not taking messages.  Can you try calling at some point and letting them know that his neck x-rays were fine with no problem.  I think he does have potentially some occult arthritis in his shoulder and I wanted to get an MRI arthrogram of the shoulder which would involve putting an injection of dye into the shoulder.  Also wanted to get MRI of the cervical spine because I think it could also be radiculopathy.  So the injection that we were talking about is essentially the MRI arthrogram part of it.  I think he needs to scans in order to figure out what is going on with the neck/shoulder region.  Thanks

## 2023-07-07 NOTE — Telephone Encounter (Signed)
Both of these orders have been placed. Tried following up-will try again on Monday.

## 2023-07-11 DIAGNOSIS — Z125 Encounter for screening for malignant neoplasm of prostate: Secondary | ICD-10-CM | POA: Diagnosis not present

## 2023-07-11 DIAGNOSIS — F1111 Opioid abuse, in remission: Secondary | ICD-10-CM | POA: Diagnosis not present

## 2023-07-11 DIAGNOSIS — Z130 Encounter for screening for diseases of the blood and blood-forming organs and certain disorders involving the immune mechanism: Secondary | ICD-10-CM | POA: Diagnosis not present

## 2023-07-11 DIAGNOSIS — Z13220 Encounter for screening for lipoid disorders: Secondary | ICD-10-CM | POA: Diagnosis not present

## 2023-07-11 DIAGNOSIS — E669 Obesity, unspecified: Secondary | ICD-10-CM | POA: Diagnosis not present

## 2023-07-11 DIAGNOSIS — F411 Generalized anxiety disorder: Secondary | ICD-10-CM | POA: Diagnosis not present

## 2023-08-11 DIAGNOSIS — Z79899 Other long term (current) drug therapy: Secondary | ICD-10-CM | POA: Diagnosis not present

## 2023-08-11 DIAGNOSIS — F419 Anxiety disorder, unspecified: Secondary | ICD-10-CM | POA: Diagnosis not present

## 2023-08-11 DIAGNOSIS — F4322 Adjustment disorder with anxiety: Secondary | ICD-10-CM | POA: Diagnosis not present

## 2023-08-22 ENCOUNTER — Inpatient Hospital Stay: Admission: RE | Admit: 2023-08-22 | Payer: BC Managed Care – PPO | Source: Ambulatory Visit

## 2023-08-22 ENCOUNTER — Other Ambulatory Visit: Payer: BC Managed Care – PPO

## 2023-10-17 DIAGNOSIS — F4322 Adjustment disorder with anxiety: Secondary | ICD-10-CM | POA: Diagnosis not present

## 2023-10-17 DIAGNOSIS — F419 Anxiety disorder, unspecified: Secondary | ICD-10-CM | POA: Diagnosis not present
